# Patient Record
Sex: Male | Born: 1965
Health system: Southern US, Community
[De-identification: ages and names within clinical notes are randomized; demographics above are authoritative.]

## PROBLEM LIST (undated history)

## (undated) DIAGNOSIS — E119 Type 2 diabetes mellitus without complications: Secondary | ICD-10-CM

## (undated) DIAGNOSIS — M109 Gout, unspecified: Secondary | ICD-10-CM

## (undated) DIAGNOSIS — K118 Other diseases of salivary glands: Secondary | ICD-10-CM

## (undated) DIAGNOSIS — M1990 Unspecified osteoarthritis, unspecified site: Secondary | ICD-10-CM

## (undated) HISTORY — DX: Unspecified osteoarthritis, unspecified site: M19.90

## (undated) HISTORY — PX: TONSILLECTOMY: SUR1361

## (undated) HISTORY — DX: Type 2 diabetes mellitus without complications: E11.9

---

## 2015-05-24 DIAGNOSIS — M5134 Other intervertebral disc degeneration, thoracic region: Secondary | ICD-10-CM | POA: Insufficient documentation

## 2018-02-15 ENCOUNTER — Ambulatory Visit: Payer: BLUE CROSS/BLUE SHIELD | Admitting: Family Medicine

## 2018-02-15 ENCOUNTER — Encounter: Payer: Self-pay | Admitting: Family Medicine

## 2018-02-15 VITALS — BP 130/82 | HR 77 | Temp 97.4°F | Ht 71.0 in | Wt 221.8 lb

## 2018-02-15 DIAGNOSIS — Z23 Encounter for immunization: Secondary | ICD-10-CM | POA: Diagnosis not present

## 2018-02-15 DIAGNOSIS — M10072 Idiopathic gout, left ankle and foot: Secondary | ICD-10-CM

## 2018-02-15 LAB — BASIC METABOLIC PANEL
BUN: 14 mg/dL (ref 7–25)
CALCIUM: 9.3 mg/dL (ref 8.6–10.3)
CO2: 24 mmol/L (ref 20–32)
Chloride: 105 mmol/L (ref 98–110)
Creat: 0.89 mg/dL (ref 0.70–1.33)
GLUCOSE: 122 mg/dL — AB (ref 65–99)
Potassium: 4 mmol/L (ref 3.5–5.3)
SODIUM: 140 mmol/L (ref 135–146)

## 2018-02-15 MED ORDER — COLCHICINE 0.6 MG PO TABS: ORAL_TABLET | ORAL | 0 refills | Status: DC

## 2018-02-15 NOTE — Patient Instructions (Signed)

## 2018-02-15 NOTE — Assessment & Plan Note (Signed)
-  History and clinical exam consistent with gout flare -Has tried ibuprofen without significant improvement. -Recommend trial of colchicine, check renal function today.  -Will plan to check uric acid once acute flare has resolved.

## 2018-02-15 NOTE — Progress Notes (Signed)
Joshua Morse - 52 y.o. male MRN [id number]  Date of birth: 07/18/1965  Subjective Chief Complaint  Patient presents with  . Gout    HPI Joshua Morse is a 52 y.o. male with history of gout here today with complaint of gout flare.  He reports pain and swelling in the L ankle. He typically has gout flares in the ankle.  This  episode started about 2 Fuente ago, started to improve some a few days ago but then returned.  He denies trauma to the ankle.  He has been taking ibuprofen which has helped some but symptoms have persisted.   He does drink a moderate amount of alcohol but denies any other recent dietary changes.  He has been staying well hydrated.  He does not think he has ever had uric acid levels checked.   ROS:  A comprehensive ROS was completed and negative except as noted per HPI  No Known Allergies  History reviewed. No pertinent past medical history.  Past Surgical History:  Procedure Laterality Date  . TONSILLECTOMY      Social History   Socioeconomic History  . Marital status: Married    Spouse name: Not on file  . Number of children: Not on file  . Years of education: Not on file  . Highest education level: Not on file  Occupational History  . Not on file  Social Needs  . Financial resource strain: Not on file  . Food insecurity:    Worry: Not on file    Inability: Not on file  . Transportation needs:    Medical: Not on file    Non-medical: Not on file  Tobacco Use  . Smoking status: Former Research scientist (life sciences)  . Smokeless tobacco: Never Used  Substance and Sexual Activity  . Alcohol use: Yes    Frequency: Never    Comment: ocass  . Drug use: Never  . Sexual activity: Not on file  Lifestyle  . Physical activity:    Days per week: Not on file    Minutes per session: Not on file  . Stress: Not on file  Relationships  . Social connections:    Talks on phone: Not on file    Gets together: Not on file    Attends religious service: Not on file    Active member of  club or organization: Not on file    Attends meetings of clubs or organizations: Not on file    Relationship status: Not on file  Other Topics Concern  . Not on file  Social History Narrative  . Not on file    Family History  Problem Relation Age of Onset  . Gout Maternal Grandmother   . Gout Paternal Grandfather     Health Maintenance  Topic Date Due  . Samul Dada  08/31/1982  . COLONOSCOPY  08/30/2013  . INFLUENZA VACCINE  11/08/2017  . Hepatitis C Screening  02/16/2019 (Originally March 09, 1966)  . HIV Screening  02/16/2019 (Originally 08/31/1978)    ----------------------------------------------------------------------------------------------------------------------------------------------------------------------------------------------------------------- Physical Exam BP 130/82   Pulse 77   Temp (!) 97.4 F (36.3 C) (Oral)   Ht 5\' 11"  (1.803 m)   Wt 221 lb 12.8 oz (100.6 kg)   SpO2 95%   BMI 30.93 kg/m   Physical Exam  Constitutional: He is oriented to person, place, and time. He appears well-nourished. No distress.  HENT:  Head: Normocephalic and atraumatic.  Mouth/Throat: Oropharynx is clear and moist.  Eyes: No scleral icterus.  Neck: Neck supple.  Cardiovascular: Normal  rate, regular rhythm and normal heart sounds.  Pulmonary/Chest: Effort normal and breath sounds normal.  Musculoskeletal:  L ankle with effusion and tenderness to palpation.  Warm to touch.  DP pulse 1+  Neurological: He is alert and oriented to person, place, and time.  Skin: Skin is warm and dry.  Psychiatric: He has a normal mood and affect. His behavior is normal.    ------------------------------------------------------------------------------------------------------------------------------------------------------------------------------------------------------------------- Assessment and Plan  Acute idiopathic gout of left ankle -History and clinical exam consistent with gout  flare -Has tried ibuprofen without significant improvement. -Recommend trial of colchicine, check renal function today.  -Will plan to check uric acid once acute flare has resolved.

## 2018-02-15 NOTE — Progress Notes (Signed)
Joshua Morse - 52 y.o. male MRN [id number]  Date of birth: 1965-11-19  Subjective Chief Complaint  Patient presents with  . Gout    HPI 52 year old presents today to establish care. He presents with gout exacerbation to left ankle that began two Deoliveira ago. Patient has had previous exacerbations that were managed with anti-inflammatories, increasing fluids, and reducing purines in diet. However, those methods have not resolved the current exacerbation. Patient reports taking scheduled ibuprofen with minimal relief. Patient denies any recent changes to his diet to contribute to this exacerbation.    No Known Allergies  History reviewed. No pertinent past medical history.  History reviewed. No pertinent surgical history.  Social History   Socioeconomic History  . Marital status: Married    Spouse name: Not on file  . Number of children: Not on file  . Years of education: Not on file  . Highest education level: Not on file  Occupational History  . Not on file  Social Needs  . Financial resource strain: Not on file  . Food insecurity:    Worry: Not on file    Inability: Not on file  . Transportation needs:    Medical: Not on file    Non-medical: Not on file  Tobacco Use  . Smoking status: Former Research scientist (life sciences)  . Smokeless tobacco: Never Used  Substance and Sexual Activity  . Alcohol use: Yes    Frequency: Never    Comment: ocass  . Drug use: Never  . Sexual activity: Not on file  Lifestyle  . Physical activity:    Days per week: Not on file    Minutes per session: Not on file  . Stress: Not on file  Relationships  . Social connections:    Talks on phone: Not on file    Gets together: Not on file    Attends religious service: Not on file    Active member of club or organization: Not on file    Attends meetings of clubs or organizations: Not on file    Relationship status: Not on file  Other Topics Concern  . Not on file  Social History Narrative  . Not on file     Family History  Problem Relation Age of Onset  . Gout Maternal Grandmother   . Gout Paternal Grandfather     Health Maintenance  Topic Date Due  . Samul Dada  08/31/1982  . COLONOSCOPY  08/30/2013  . INFLUENZA VACCINE  11/08/2017  . Hepatitis C Screening  02/16/2019 (Originally Aug 17, 1965)  . HIV Screening  02/16/2019 (Originally 08/31/1978)    ROS Cardiac: Pt denies chest pain, pressure, tightness Pulmonary: Pt denies shortness of breath, dyspnea, or cough.  Musculoskeletal: Pt denies tenderness to other joints.  ----------------------------------------------------------------------------------------------------------------------------------------------------------------------------------------------------------------- Physical Exam BP 130/82   Pulse 77   Temp (!) 97.4 F (36.3 C) (Oral)   Ht 5\' 11"  (1.803 m)   Wt 221 lb 12.8 oz (100.6 kg)   SpO2 95%   BMI 30.93 kg/m   Physical Exam  Constitutional: He is oriented to person, place, and time.  Cardiovascular: Normal rate, regular rhythm and normal heart sounds.  Pulses:      Dorsalis pedis pulses are 1+ on the left side.  Pulmonary/Chest: Effort normal and breath sounds normal.  Musculoskeletal:       Left ankle: He exhibits swelling. Tenderness.  Neurological: He is alert and oriented to person, place, and time.  Skin: Skin is warm and dry. Capillary refill takes less than 2  seconds.    ------------------------------------------------------------------------------------------------------------------------------------------------------------------------------------------------------------------- Assessment and Plan  Gout -Begin taking Cholchicine 1.2mg  today and then 0.6mg  1 hour afterwards. Continue taking 0.6mg  daily until gout resolves.  -Continue increasing fluids and reduced purine diet -BMP

## 2018-02-18 NOTE — Progress Notes (Signed)
Normal kidney function

## 2018-03-01 ENCOUNTER — Other Ambulatory Visit: Payer: Self-pay | Admitting: Family Medicine

## 2018-03-01 ENCOUNTER — Telehealth: Payer: Self-pay

## 2018-03-01 MED ORDER — COLCHICINE 0.6 MG PO TABS: ORAL_TABLET | ORAL | 0 refills | Status: DC

## 2018-03-01 NOTE — Telephone Encounter (Signed)
Please advise 

## 2018-03-01 NOTE — Telephone Encounter (Signed)
Left a detailed VM for patient regarding prescription being sent to the pharmacy.

## 2018-03-01 NOTE — Telephone Encounter (Signed)
Copied from [place] [id number]. Topic: General - Other >> Mar 01, 2018  8:09 AM Yvette Rack wrote: Reason for CRM: pt calling wanting Dr Zigmund Daniel to know that his symptom has come back he has pain in his lt ankle and would like to know what he should do he was given colchicine 0.6 MG tablet and now he's out

## 2018-03-01 NOTE — Telephone Encounter (Signed)
Renewal for colchicine sent in.

## 2018-04-16 ENCOUNTER — Encounter: Payer: Self-pay | Admitting: Family Medicine

## 2018-04-16 ENCOUNTER — Ambulatory Visit (INDEPENDENT_AMBULATORY_CARE_PROVIDER_SITE_OTHER): Payer: BLUE CROSS/BLUE SHIELD | Admitting: Family Medicine

## 2018-04-16 VITALS — BP 132/70 | HR 66 | Temp 98.0°F | Ht 71.0 in | Wt 222.0 lb

## 2018-04-16 DIAGNOSIS — R7309 Other abnormal glucose: Secondary | ICD-10-CM | POA: Diagnosis not present

## 2018-04-16 DIAGNOSIS — Z0000 Encounter for general adult medical examination without abnormal findings: Secondary | ICD-10-CM | POA: Insufficient documentation

## 2018-04-16 DIAGNOSIS — Z13220 Encounter for screening for lipoid disorders: Secondary | ICD-10-CM | POA: Diagnosis not present

## 2018-04-16 DIAGNOSIS — M109 Gout, unspecified: Secondary | ICD-10-CM

## 2018-04-16 DIAGNOSIS — L309 Dermatitis, unspecified: Secondary | ICD-10-CM | POA: Insufficient documentation

## 2018-04-16 DIAGNOSIS — Z125 Encounter for screening for malignant neoplasm of prostate: Secondary | ICD-10-CM | POA: Diagnosis not present

## 2018-04-16 DIAGNOSIS — R220 Localized swelling, mass and lump, head: Secondary | ICD-10-CM

## 2018-04-16 LAB — COMPREHENSIVE METABOLIC PANEL
ALK PHOS: 113 U/L (ref 39–117)
ALT: 35 U/L (ref 0–53)
AST: 25 U/L (ref 0–37)
Albumin: 4.3 g/dL (ref 3.5–5.2)
BILIRUBIN TOTAL: 0.5 mg/dL (ref 0.2–1.2)
BUN: 13 mg/dL (ref 6–23)
CALCIUM: 9.6 mg/dL (ref 8.4–10.5)
CO2: 26 mEq/L (ref 19–32)
Chloride: 106 mEq/L (ref 96–112)
Creatinine, Ser: 0.92 mg/dL (ref 0.40–1.50)
GFR: 90.91 mL/min (ref >60.00)
Glucose, Bld: 119 mg/dL — ABNORMAL HIGH (ref 70–99)
Potassium: 4.4 mEq/L (ref 3.5–5.1)
Sodium: 139 mEq/L (ref 135–145)
TOTAL PROTEIN: 6.8 g/dL (ref 6.0–8.3)

## 2018-04-16 LAB — CBC
HEMATOCRIT: 47.9 % (ref 39.0–52.0)
Hemoglobin: 16 g/dL (ref 13.0–17.0)
MCHC: 33.5 g/dL (ref 30.0–36.0)
MCV: 97.9 fl (ref 78.0–100.0)
Platelets: 255 10*3/uL (ref 150.0–400.0)
RBC: 4.89 Mil/uL (ref 4.22–5.81)
RDW: 13.5 % (ref 11.5–15.5)
WBC: 8.6 10*3/uL (ref 4.0–10.5)

## 2018-04-16 LAB — LIPID PANEL
CHOLESTEROL: 219 mg/dL — AB (ref 0–200)
HDL: 45.4 mg/dL (ref >39.00)
LDL Cholesterol: 149 mg/dL — ABNORMAL HIGH (ref 0–99)
NonHDL: 173.41
TRIGLYCERIDES: 123 mg/dL (ref 0.0–149.0)
Total CHOL/HDL Ratio: 5
VLDL: 24.6 mg/dL (ref 0.0–40.0)

## 2018-04-16 LAB — PSA: PSA: 1.06 ng/mL (ref 0.10–4.00)

## 2018-04-16 LAB — URIC ACID: Uric Acid, Serum: 7.2 mg/dL (ref 4.0–7.8)

## 2018-04-16 LAB — TSH: TSH: 1.68 u[IU]/mL (ref 0.35–4.50)

## 2018-04-16 LAB — HEMOGLOBIN A1C: Hgb A1c MFr Bld: 6.2 % (ref 4.6–6.5)

## 2018-04-16 MED ORDER — COLCHICINE 0.6 MG PO TABS: ORAL_TABLET | ORAL | 0 refills | Status: DC

## 2018-04-16 NOTE — Assessment & Plan Note (Signed)
Well adult Orders Placed This Encounter  Procedures  . Comp Met (CMET)  . HgB A1c  . Lipid Profile  . TSH  . PSA  . CBC  . Uric acid  -Screenings:  Cologuard, lipid and psa screenings -Immunizations:  Up to date -Anticipatory guidance/Risk factor reduction:  Recommendations per AVS

## 2018-04-16 NOTE — Patient Instructions (Signed)

## 2018-04-16 NOTE — Assessment & Plan Note (Signed)
-  Unclear etiology, will monitor over the next couple of Schranz and if not resolving recommend ENT evaluation.

## 2018-04-16 NOTE — Assessment & Plan Note (Signed)
-  Area on leg consistent with eczema, recommend trial of OTC cortisone cream.

## 2018-04-16 NOTE — Progress Notes (Signed)
Joshua Morse - 53 y.o. male MRN [id number]  Date of birth: 01/28/1966  Subjective Chief Complaint  Patient presents with  . Annual Exam    wants to discuss swelling on left cheek and rash lower left leg has been applying OTC cream    HPI Joshua Morse is a 53 y.o. male with history of gout here today for annual exam.  Previous gout flare has cleared up.  He has noticed swelling of left cheek/jaw area about 2 Liddy ago.  He denies pain, difficulty swallowing, fever, chills or chewing.  Had dental implant last summer.  He also noticed small, itchy, scaly patch on L lower extremity.  Using benadryl cream without improvement.  Otherwise doing well.  He has never had colon cancer screening.  He is a non-smoker.   Review of Systems  Constitutional: Negative for chills, fever, malaise/fatigue and weight loss.  HENT: Negative for congestion, ear pain and sore throat.   Eyes: Negative for blurred vision, double vision and pain.  Respiratory: Negative for cough and shortness of breath.   Cardiovascular: Negative for chest pain and palpitations.  Gastrointestinal: Negative for abdominal pain, blood in stool, constipation, heartburn and nausea.  Genitourinary: Negative for dysuria and urgency.  Musculoskeletal: Negative for joint pain and myalgias.  Skin: Positive for rash.  Neurological: Negative for dizziness and headaches.  Endo/Heme/Allergies: Does not bruise/bleed easily.  Psychiatric/Behavioral: Negative for depression. The patient is not nervous/anxious and does not have insomnia.     No Known Allergies  No past medical history on file.  Past Surgical History:  Procedure Laterality Date  . TONSILLECTOMY      Social History   Socioeconomic History  . Marital status: Married    Spouse name: Not on file  . Number of children: Not on file  . Years of education: Not on file  . Highest education level: Not on file  Occupational History  . Not on file  Social Needs  . Financial  resource strain: Not on file  . Food insecurity:    Worry: Not on file    Inability: Not on file  . Transportation needs:    Medical: Not on file    Non-medical: Not on file  Tobacco Use  . Smoking status: Former Research scientist (life sciences)  . Smokeless tobacco: Never Used  Substance and Sexual Activity  . Alcohol use: Yes    Frequency: Never    Comment: ocass  . Drug use: Never  . Sexual activity: Not on file  Lifestyle  . Physical activity:    Days per week: Not on file    Minutes per session: Not on file  . Stress: Not on file  Relationships  . Social connections:    Talks on phone: Not on file    Gets together: Not on file    Attends religious service: Not on file    Active member of club or organization: Not on file    Attends meetings of clubs or organizations: Not on file    Relationship status: Not on file  Other Topics Concern  . Not on file  Social History Narrative  . Not on file    Family History  Problem Relation Age of Onset  . Gout Maternal Grandmother   . Gout Paternal Grandfather     Health Maintenance  Topic Date Due  . Samul Dada  08/31/1982  . COLONOSCOPY  08/30/2013  . Hepatitis C Screening  02/16/2019 (Originally 1965/06/29)  . HIV Screening  02/16/2019 (Originally 08/31/1978)  .  INFLUENZA VACCINE  Completed    ----------------------------------------------------------------------------------------------------------------------------------------------------------------------------------------------------------------- Physical Exam BP 132/70   Pulse 66   Temp 98 F (36.7 C) (Oral)   Ht _0  (1.803 m)   Wt 222 lb (100.7 kg)   SpO2 98%   BMI 30.96 kg/m   Physical Exam Constitutional:      General: He is not in acute distress. HENT:     Head: Normocephalic and atraumatic.     Right Ear: External ear normal.     Left Ear: External ear normal.     Nose: No congestion.     Mouth/Throat:     Mouth: Mucous membranes are moist.     Comments: assymetry  of masseter area L>R.  Non-tender, no adenopathy.  Eyes:     General: No scleral icterus. Neck:     Musculoskeletal: Normal range of motion.     Thyroid: No thyromegaly.  Cardiovascular:     Rate and Rhythm: Normal rate and regular rhythm.     Heart sounds: Normal heart sounds.  Pulmonary:     Effort: Pulmonary effort is normal.     Breath sounds: Normal breath sounds.  Abdominal:     General: Bowel sounds are normal. There is no distension.     Palpations: Abdomen is soft.     Tenderness: There is no abdominal tenderness. There is no guarding.  Lymphadenopathy:     Cervical: No cervical adenopathy.  Skin:    General: Skin is warm and dry.     Findings: Rash (small, scaly patch left anterior lower leg. ) present.  Neurological:     Mental Status: He is alert and oriented to person, place, and time.     Cranial Nerves: No cranial nerve deficit.     Motor: No abnormal muscle tone.  Psychiatric:        Behavior: Behavior normal.     ------------------------------------------------------------------------------------------------------------------------------------------------------------------------------------------------------------------- Assessment and Plan  Well adult exam Well adult Orders Placed This Encounter  Procedures  . Comp Met (CMET)  . HgB A1c  . Lipid Profile  . TSH  . PSA  . CBC  . Uric acid  -Screenings:  Cologuard, lipid and psa screenings -Immunizations:  Up to date -Anticipatory guidance/Risk factor reduction:  Recommendations per AVS  Jaw swelling -Unclear etiology, will monitor over the next couple of W[place] and if not resolving recommend ENT evaluation.   Eczema -Area on leg consistent with eczema, recommend trial of OTC cortisone cream.

## 2018-04-22 NOTE — Progress Notes (Signed)
-  Blood sugar and cholesterol are elevated.  I would recommend a low carb/low fat diet with routine exercise to help improve these numbers.  We'll plan to re-check in about 6 months.  -Uric acid levels are at the high end of normal.  We'll continue to monitor for now.

## 2018-04-29 DIAGNOSIS — Z1212 Encounter for screening for malignant neoplasm of rectum: Secondary | ICD-10-CM | POA: Diagnosis not present

## 2018-04-29 DIAGNOSIS — Z1211 Encounter for screening for malignant neoplasm of colon: Secondary | ICD-10-CM | POA: Diagnosis not present

## 2018-04-29 LAB — COLOGUARD

## 2018-06-05 ENCOUNTER — Telehealth: Payer: Self-pay | Admitting: Family Medicine

## 2018-06-05 DIAGNOSIS — L309 Dermatitis, unspecified: Secondary | ICD-10-CM

## 2018-06-05 NOTE — Telephone Encounter (Signed)
Pt requesting a referral to Dermatology re: spots on shin as discussed in last OV.   Pt: [phone]

## 2018-06-05 NOTE — Telephone Encounter (Signed)
[facility] to [facility] referral

## 2018-06-05 NOTE — Telephone Encounter (Signed)
[facility] to [facility] referral.

## 2018-06-06 NOTE — Telephone Encounter (Signed)
Referral placed, patient notified.

## 2018-06-15 DIAGNOSIS — L308 Other specified dermatitis: Secondary | ICD-10-CM | POA: Diagnosis not present

## 2018-07-13 DIAGNOSIS — D171 Benign lipomatous neoplasm of skin and subcutaneous tissue of trunk: Secondary | ICD-10-CM | POA: Diagnosis not present

## 2018-07-13 DIAGNOSIS — L309 Dermatitis, unspecified: Secondary | ICD-10-CM | POA: Diagnosis not present

## 2018-10-02 DIAGNOSIS — Z03818 Encounter for observation for suspected exposure to other biological agents ruled out: Secondary | ICD-10-CM | POA: Diagnosis not present

## 2018-10-14 ENCOUNTER — Encounter: Payer: Self-pay | Admitting: Family Medicine

## 2018-10-14 ENCOUNTER — Ambulatory Visit: Payer: BC Managed Care – PPO | Admitting: Family Medicine

## 2018-10-14 VITALS — BP 118/90 | HR 71 | Temp 98.1°F | Resp 18 | Ht 71.0 in | Wt 224.0 lb

## 2018-10-14 DIAGNOSIS — M10072 Idiopathic gout, left ankle and foot: Secondary | ICD-10-CM

## 2018-10-14 DIAGNOSIS — R739 Hyperglycemia, unspecified: Secondary | ICD-10-CM

## 2018-10-14 DIAGNOSIS — E7800 Pure hypercholesterolemia, unspecified: Secondary | ICD-10-CM | POA: Diagnosis not present

## 2018-10-14 LAB — LIPID PANEL
Cholesterol: 186 mg/dL (ref 0–200)
HDL: 41.1 mg/dL (ref >39.00)
NonHDL: 145.33
Total CHOL/HDL Ratio: 5
Triglycerides: 222 mg/dL — ABNORMAL HIGH (ref 0.0–149.0)
VLDL: 44.4 mg/dL — ABNORMAL HIGH (ref 0.0–40.0)

## 2018-10-14 LAB — BASIC METABOLIC PANEL
BUN: 17 mg/dL (ref 6–23)
CO2: 25 mEq/L (ref 19–32)
Calcium: 9.8 mg/dL (ref 8.4–10.5)
Chloride: 106 mEq/L (ref 96–112)
Creatinine, Ser: 1.01 mg/dL (ref 0.40–1.50)
GFR: 76.66 mL/min (ref >60.00)
Glucose, Bld: 116 mg/dL — ABNORMAL HIGH (ref 70–99)
Potassium: 4.6 mEq/L (ref 3.5–5.1)
Sodium: 139 mEq/L (ref 135–145)

## 2018-10-14 LAB — LDL CHOLESTEROL, DIRECT: Direct LDL: 117 mg/dL

## 2018-10-14 LAB — HEMOGLOBIN A1C: Hgb A1c MFr Bld: 6.3 % (ref 4.6–6.5)

## 2018-10-14 LAB — URIC ACID: Uric Acid, Serum: 8.1 mg/dL — ABNORMAL HIGH (ref 4.0–7.8)

## 2018-10-14 MED ORDER — COLCHICINE 0.6 MG PO TABS: ORAL_TABLET | ORAL | 2 refills | Status: DC

## 2018-10-14 NOTE — Assessment & Plan Note (Signed)
Update blood sugar and a1c.

## 2018-10-14 NOTE — Patient Instructions (Signed)
Gout  Gout is painful swelling of your joints. Gout is a type of arthritis. It is caused by having too much uric acid in your body. Uric acid is a chemical that is made when your body breaks down substances called purines. If your body has too much uric acid, sharp crystals can form and build up in your joints. This causes pain and swelling. Gout attacks can happen quickly and be very painful (acute gout). Over time, the attacks can affect more joints and happen more often (chronic gout). What are the causes?  Too much uric acid in your blood. This can happen because: ? Your kidneys do not remove enough uric acid from your blood. ? Your body makes too much uric acid. ? You eat too many foods that are high in purines. These foods include organ meats, some seafood, and beer.  Trauma or stress. What increases the risk?  Having a family history of gout.  Being male and middle-aged.  Being male and having gone through menopause.  Being very overweight (obese).  Drinking alcohol, especially beer.  Not having enough water in the body (being dehydrated).  Losing weight too quickly.  Having an organ transplant.  Having lead poisoning.  Taking certain medicines.  Having kidney disease.  Having a skin condition called psoriasis. What are the signs or symptoms? An attack of acute gout usually happens in just one joint. The most common place is the big toe. Attacks often start at night. Other joints that may be affected include joints of the feet, ankle, knee, fingers, wrist, or elbow. Symptoms of an attack may include:  Very bad pain.  Warmth.  Swelling.  Stiffness.  Shiny, red, or purple skin.  Tenderness. The affected joint may be very painful to touch.  Chills and fever. Chronic gout may cause symptoms more often. More joints may be involved. You may also have white or yellow lumps (tophi) on your hands or feet or in other areas near your joints. How is this  treated?  Treatment for this condition has two phases: treating an acute attack and preventing future attacks.  Acute gout treatment may include: ? NSAIDs. ? Steroids. These are taken by mouth or injected into a joint. ? Colchicine. This medicine relieves pain and swelling. It can be given by mouth or through an IV tube.  Preventive treatment may include: ? Taking small doses of NSAIDs or colchicine daily. ? Using a medicine that reduces uric acid levels in your blood. ? Making changes to your diet. You may need to see a food expert (dietitian) about what to eat and drink to prevent gout. Follow these instructions at home: During a gout attack   If told, put ice on the painful area: ? Put ice in a plastic bag. ? Place a towel between your skin and the bag. ? Leave the ice on for 20 minutes, 2-3 times a day.  Raise (elevate) the painful joint above the level of your heart as often as you can.  Rest the joint as much as possible. If the joint is in your leg, you may be given crutches.  Follow instructions from your doctor about what you cannot eat or drink. Avoiding future gout attacks  Eat a low-purine diet. Avoid foods and drinks such as: ? Liver. ? Kidney. ? Anchovies. ? Asparagus. ? Herring. ? Mushrooms. ? Mussels. ? Beer.  Stay at a healthy weight. If you want to lose weight, talk with your doctor. Do not lose weight   too fast.  Start or continue an exercise plan as told by your doctor. Eating and drinking  Drink enough fluids to keep your pee (urine) pale yellow.  If you drink alcohol: ? Limit how much you use to:  0-1 drink a day for women.  0-2 drinks a day for men. ? Be aware of how much alcohol is in your drink. In the [place] one drink equals one 12 oz bottle of beer (355 mL), one 5 oz glass of wine (148 mL), or one 1 oz glass of hard liquor (44 mL). General instructions  Take over-the-counter and prescription medicines only as told by your doctor.  Do  not drive or use heavy machinery while taking prescription pain medicine.  Return to your normal activities as told by your doctor. Ask your doctor what activities are safe for you.  Keep all follow-up visits as told by your doctor. This is important. Contact a doctor if:  You have another gout attack.  You still have symptoms of a gout attack after 10 days of treatment.  You have problems (side effects) because of your medicines.  You have chills or a fever.  You have burning pain when you pee (urinate).  You have pain in your lower back or belly. Get help right away if:  You have very bad pain.  Your pain cannot be controlled.  You cannot pee. Summary  Gout is painful swelling of the joints.  The most common site of pain is the big toe, but it can affect other joints.  Medicines and avoiding some foods can help to prevent and treat gout attacks. This information is not intended to replace advice given to you by your health care provider. Make sure you discuss any questions you have with your health care provider. Document Released: 01/04/2008 Document Revised: 10/17/2017 Document Reviewed: 10/17/2017 [employer]vier Patient Education  2020 [employer]  

## 2018-10-14 NOTE — Progress Notes (Signed)
Joshua Morse - 53 y.o. male MRN [id number]  Date of birth: [birth year]  Subjective Chief Complaint  Patient presents with  . Follow-up    6 mo F/U Gout/HDL?DM , 1 X flare-up in 06/2018    HPI Joshua Morse is a 53 y.o. male here today for follow up of gout as well as elevated glucose and lipids on prior lab work.  He reports he is doing well.  He had a gout flare of the L ankle in March that resolved with colchicine.  He has been working on dietary changes to better manage blood sugar, cholesterol and gout flares.    ROS:  A comprehensive ROS was completed and negative except as noted per HPI  No Known Allergies  No past medical history on file.  Past Surgical History:  Procedure Laterality Date  . TONSILLECTOMY      Social History   Socioeconomic History  . Marital status: Married    Spouse name: Not on file  . Number of children: Not on file  . Years of education: Not on file  . Highest education level: Not on file  Occupational History  . Not on file  Social Needs  . Financial resource strain: Not on file  . Food insecurity    Worry: Not on file    Inability: Not on file  . Transportation needs    Medical: Not on file    Non-medical: Not on file  Tobacco Use  . Smoking status: Former Research scientist (life sciences)  . Smokeless tobacco: Never Used  Substance and Sexual Activity  . Alcohol use: Yes    Frequency: Never    Comment: ocass  . Drug use: Never  . Sexual activity: Not on file  Lifestyle  . Physical activity    Days per week: Not on file    Minutes per session: Not on file  . Stress: Not on file  Relationships  . Social Herbalist on phone: Not on file    Gets together: Not on file    Attends religious service: Not on file    Active member of club or organization: Not on file    Attends meetings of clubs or organizations: Not on file    Relationship status: Not on file  Other Topics Concern  . Not on file  Social History Narrative  . Not on file     Family History  Problem Relation Age of Onset  . Gout Maternal Grandmother   . Gout Paternal Grandfather     Health Maintenance  Topic Date Due  . Samul Dada  08/31/1982  . Hepatitis C Screening  02/16/2019 (Originally 1965/08/21)  . HIV Screening  02/16/2019 (Originally 08/31/1978)  . INFLUENZA VACCINE  11/09/2018  . Fecal DNA (Cologuard)  04/29/2021    ----------------------------------------------------------------------------------------------------------------------------------------------------------------------------------------------------------------- Physical Exam BP 118/90   Pulse 71   Temp 98.1 F (36.7 C) (Oral)   Resp 18   Ht 5\' 11"  (1.803 m)   Wt 224 lb (101.6 kg)   SpO2 98%   BMI 31.24 kg/m   Physical Exam Constitutional:      Appearance: Normal appearance.  HENT:     Head: Normocephalic and atraumatic.     Mouth/Throat:     Mouth: Mucous membranes are moist.  Eyes:     General: No scleral icterus. Neck:     Musculoskeletal: Neck supple.  Cardiovascular:     Rate and Rhythm: Normal rate and regular rhythm.  Pulmonary:     Effort: Pulmonary effort  is normal.     Breath sounds: Normal breath sounds.  Skin:    General: Skin is warm and dry.  Neurological:     General: No focal deficit present.     Mental Status: He is alert.  Psychiatric:        Mood and Affect: Mood normal.        Behavior: Behavior normal.     ------------------------------------------------------------------------------------------------------------------------------------------------------------------------------------------------------------------- Assessment and Plan  Acute idiopathic gout of left ankle Gout flares well controlled with colchicine as needed.  Update uric acid levels today.   Elevated cholesterol -Recheck lipid panel today.   Elevated blood sugar Update blood sugar and a1c.

## 2018-10-14 NOTE — Assessment & Plan Note (Signed)
-  Recheck lipid panel today

## 2018-10-14 NOTE — Assessment & Plan Note (Signed)
Gout flares well controlled with colchicine as needed.  Update uric acid levels today.

## 2018-10-16 NOTE — Progress Notes (Signed)
-  Cholesterol and TG are up some.  Recommend a low fat/low cholesterol diet with regular exercise.   -Uric acid is mildly elevated, if he starts to have increased gout flares let me know.  -Additional labs look ok.

## 2019-02-07 ENCOUNTER — Other Ambulatory Visit: Payer: Self-pay

## 2019-02-07 DIAGNOSIS — Z20822 Contact with and (suspected) exposure to covid-19: Secondary | ICD-10-CM

## 2019-02-09 LAB — NOVEL CORONAVIRUS, NAA: SARS-CoV-2, NAA: NOT DETECTED

## 2019-11-05 ENCOUNTER — Other Ambulatory Visit: Payer: Self-pay

## 2019-11-05 ENCOUNTER — Encounter: Payer: Self-pay | Admitting: Family Medicine

## 2019-11-05 ENCOUNTER — Ambulatory Visit: Payer: BC Managed Care – PPO | Admitting: Family Medicine

## 2019-11-05 VITALS — BP 130/100 | HR 76 | Temp 98.2°F | Ht 71.0 in | Wt 222.2 lb

## 2019-11-05 DIAGNOSIS — R739 Hyperglycemia, unspecified: Secondary | ICD-10-CM

## 2019-11-05 DIAGNOSIS — R220 Localized swelling, mass and lump, head: Secondary | ICD-10-CM | POA: Diagnosis not present

## 2019-11-05 DIAGNOSIS — E7800 Pure hypercholesterolemia, unspecified: Secondary | ICD-10-CM

## 2019-11-05 DIAGNOSIS — M109 Gout, unspecified: Secondary | ICD-10-CM | POA: Diagnosis not present

## 2019-11-05 LAB — LIPID PANEL
Cholesterol: 183 mg/dL (ref 0–200)
HDL: 41.4 mg/dL (ref >39.00)
NonHDL: 141.31
Total CHOL/HDL Ratio: 4
Triglycerides: 220 mg/dL — ABNORMAL HIGH (ref 0.0–149.0)
VLDL: 44 mg/dL — ABNORMAL HIGH (ref 0.0–40.0)

## 2019-11-05 LAB — CBC
HCT: 46.7 % (ref 39.0–52.0)
Hemoglobin: 15.9 g/dL (ref 13.0–17.0)
MCHC: 34.1 g/dL (ref 30.0–36.0)
MCV: 99.4 fl (ref 78.0–100.0)
Platelets: 228 10*3/uL (ref 150.0–400.0)
RBC: 4.7 Mil/uL (ref 4.22–5.81)
RDW: 13.9 % (ref 11.5–15.5)
WBC: 10.8 10*3/uL — ABNORMAL HIGH (ref 4.0–10.5)

## 2019-11-05 LAB — BASIC METABOLIC PANEL
BUN: 11 mg/dL (ref 6–23)
CO2: 25 mEq/L (ref 19–32)
Calcium: 9.4 mg/dL (ref 8.4–10.5)
Chloride: 107 mEq/L (ref 96–112)
Creatinine, Ser: 1 mg/dL (ref 0.40–1.50)
GFR: 77.25 mL/min (ref >60.00)
Glucose, Bld: 118 mg/dL — ABNORMAL HIGH (ref 70–99)
Potassium: 3.8 mEq/L (ref 3.5–5.1)
Sodium: 139 mEq/L (ref 135–145)

## 2019-11-05 LAB — ALT: ALT: 32 U/L (ref 0–53)

## 2019-11-05 LAB — URIC ACID: Uric Acid, Serum: 6.7 mg/dL (ref 4.0–7.8)

## 2019-11-05 LAB — LDL CHOLESTEROL, DIRECT: Direct LDL: 120 mg/dL

## 2019-11-05 LAB — AST: AST: 23 U/L (ref 0–37)

## 2019-11-05 LAB — HEMOGLOBIN A1C: Hgb A1c MFr Bld: 6.1 % (ref 4.6–6.5)

## 2019-11-05 NOTE — Progress Notes (Signed)
Joshua Morse is a 54 y.o. male  Chief Complaint  Patient presents with  . Acute Visit    Pt here due to swelling starting several Null ago, lt side.  Painful sometimes and also more swollen at times then others.    HPI: Joshua Morse is a 54 y.o. male who was previously a patient of Dr. Zigmund Daniel who complains of Lt sided facial swelling x several [place]. Size of swollen area fluctuates slightly. Sore at times when more swollen, not painful. No redness or overlying skin changes.  No SOB, trouble swallowing or speaking. No ear pain.  No fever, chills. No unexplained weight loss, night sweats. No recent dental work. No injury or trauma. No new meds/supplements.  He has not taken anything for symptoms.  Pt is a former smoker, quit 21 years ago. He does have a h/o gout   History reviewed. No pertinent past medical history.  Past Surgical History:  Procedure Laterality Date  . TONSILLECTOMY      Social History   Socioeconomic History  . Marital status: Married    Spouse name: Not on file  . Number of children: Not on file  . Years of education: Not on file  . Highest education level: Not on file  Occupational History  . Not on file  Tobacco Use  . Smoking status: Former Research scientist (life sciences)  . Smokeless tobacco: Never Used  Vaping Use  . Vaping Use: Never used  Substance and Sexual Activity  . Alcohol use: Yes    Comment: ocass  . Drug use: Never  . Sexual activity: Not on file  Other Topics Concern  . Not on file  Social History Narrative  . Not on file   Social Determinants of Health   Financial Resource Strain:   . Difficulty of Paying Living Expenses:   Food Insecurity:   . Worried About Charity fundraiser in the Last Year:   . Arboriculturist in the Last Year:   Transportation Needs:   . Film/video editor (Medical):   Marland Kitchen Lack of Transportation (Non-Medical):   Physical Activity:   . Days of Exercise per Week:   . Minutes of Exercise per Session:   Stress:   .  Feeling of Stress :   Social Connections:   . Frequency of Communication with Friends and Family:   . Frequency of Social Gatherings with Friends and Family:   . Attends Religious Services:   . Active Member of Clubs or Organizations:   . Attends Archivist Meetings:   Marland Kitchen Marital Status:   Intimate Partner Violence:   . Fear of Current or Ex-Partner:   . Emotionally Abused:   Marland Kitchen Physically Abused:   . Sexually Abused:     Family History  Problem Relation Age of Onset  . Gout Maternal Grandmother   . Gout Paternal Grandfather      Immunization History  Administered Date(s) Administered  . Influenza,inj,Quad PF,6+ Mos 02/15/2018    Outpatient Encounter Medications as of 11/05/2019  Medication Sig  . colchicine 0.6 MG tablet Take 1.2mg  initially followed by 0.6mg  1 hour later.  Continue 0.6mg  daily until resolution (Patient not taking: Reported on 11/05/2019)  . triamcinolone cream (KENALOG) 0.1 % APP EXT AA BID (Patient not taking: Reported on 11/05/2019)   No facility-administered encounter medications on file as of 11/05/2019.     ROS: Pertinent positives and negatives noted in HPI. Remainder of ROS non-contributory    No Known Allergies  BP (!) 130/100 (BP Location: Left Arm, Patient Position: Sitting, Cuff Size: Normal)   Pulse 76   Temp 98.2 F (36.8 C) (Temporal)   Ht 5\' 11"  (1.803 m)   Wt (!) 222 lb 3.2 oz (100.8 kg)   SpO2 98%   BMI 30.99 kg/m   BP Readings from Last 3 Encounters:  11/05/19 (!) 130/100  10/14/18 118/90  04/16/18 132/70     Physical Exam Constitutional:      General: He is not in acute distress.    Appearance: Normal appearance. He is not toxic-appearing.  HENT:     Head:     Jaw: No tenderness or pain on movement.   Lymphadenopathy:     Cervical: No cervical adenopathy.  Neurological:     Mental Status: He is alert and oriented to person, place, and time.  Psychiatric:        Mood and Affect: Mood normal.         Behavior: Behavior normal.      A/P:  1. Left facial swelling - ? Parotitis but not tender, also no erythema, warmth, and size of involved area fluctuates. Symptoms x few Shutes. No fever, chills. I am deferring abx therapy as not a clear bacterial infectious etiology. Will check labs and get [place] and f/u with pt when I have those results - US Soft Tissue Head/Neck (NON-THYROID); Future - CBC - Uric acid  2. Elevated cholesterol - Lipid panel - ALT - AST  3. Elevated blood sugar - Hemoglobin A1c  4. Gout of ankle, unspecified cause, unspecified chronicity, unspecified laterality - Basic metabolic panel - Uric acid    This visit occurred during the SARS-CoV-2 public health emergency.  Safety protocols were in place, including screening questions prior to the visit, additional usage of staff PPE, and extensive cleaning of exam room while observing appropriate contact time as indicated for disinfecting solutions.

## 2019-11-06 ENCOUNTER — Encounter: Payer: Self-pay | Admitting: Family Medicine

## 2019-11-11 ENCOUNTER — Telehealth: Payer: Self-pay | Admitting: General Practice

## 2019-11-11 ENCOUNTER — Telehealth: Payer: Self-pay

## 2019-11-11 ENCOUNTER — Other Ambulatory Visit: Payer: BC Managed Care – PPO

## 2019-11-11 ENCOUNTER — Ambulatory Visit
Admission: RE | Admit: 2019-11-11 | Discharge: 2019-11-11 | Disposition: A | Discharge status: Home / Self Care | Payer: BC Managed Care – PPO | Source: Ambulatory Visit | Attending: Family Medicine | Admitting: Family Medicine

## 2019-11-11 DIAGNOSIS — R220 Localized swelling, mass and lump, head: Secondary | ICD-10-CM

## 2019-11-11 DIAGNOSIS — K118 Other diseases of salivary glands: Secondary | ICD-10-CM

## 2019-11-11 DIAGNOSIS — K116 Mucocele of salivary gland: Secondary | ICD-10-CM | POA: Diagnosis not present

## 2019-11-11 NOTE — Telephone Encounter (Signed)
Received a call from [facility].  Soft tissue U/S of head and neck.  Cystic lesion Lt Parotid region findings, non-pacific/considerations include, cystic neoplasm or benign abscess difficult to exclude, contrast enhance CT recommended to further conclude.  Please see message.  Thank you.

## 2019-11-11 NOTE — Telephone Encounter (Signed)
error 

## 2019-11-12 NOTE — Telephone Encounter (Signed)
See scan report.

## 2019-11-12 NOTE — Telephone Encounter (Signed)
CT ordered and MyChart message sent to pt Joshua Morse, please call pt to make sure he checks his MyChart portal as I sent a message last week about his labs that he does not appear to have read. You can also let him know I ordered the f/u CT to further evaluate the cystic lesion in his Lt parotid gland.

## 2019-11-13 ENCOUNTER — Other Ambulatory Visit: Payer: BC Managed Care – PPO

## 2019-11-15 ENCOUNTER — Other Ambulatory Visit: Payer: Self-pay | Admitting: Family Medicine

## 2019-11-15 NOTE — Telephone Encounter (Signed)
I spoke to pt and he is aware of the message.  Pt said that he has an appointment set up in 2 week.

## 2019-11-24 ENCOUNTER — Other Ambulatory Visit: Payer: Self-pay | Admitting: Family Medicine

## 2019-11-25 ENCOUNTER — Inpatient Hospital Stay: Admission: RE | Admit: 2019-11-25 | Payer: BC Managed Care – PPO | Source: Ambulatory Visit

## 2019-11-25 ENCOUNTER — Ambulatory Visit
Admission: RE | Admit: 2019-11-25 | Discharge: 2019-11-25 | Disposition: A | Discharge status: Home / Self Care | Payer: BC Managed Care – PPO | Source: Ambulatory Visit | Attending: Family Medicine | Admitting: Family Medicine

## 2019-11-25 DIAGNOSIS — R220 Localized swelling, mass and lump, head: Secondary | ICD-10-CM

## 2019-11-25 DIAGNOSIS — K118 Other diseases of salivary glands: Secondary | ICD-10-CM | POA: Diagnosis not present

## 2019-11-25 DIAGNOSIS — J341 Cyst and mucocele of nose and nasal sinus: Secondary | ICD-10-CM | POA: Diagnosis not present

## 2019-11-25 DIAGNOSIS — J351 Hypertrophy of tonsils: Secondary | ICD-10-CM | POA: Diagnosis not present

## 2019-11-25 DIAGNOSIS — I700 Atherosclerosis of aorta: Secondary | ICD-10-CM | POA: Diagnosis not present

## 2019-11-25 MED ORDER — IOPAMIDOL (ISOVUE-300) INJECTION 61%
75.0000 mL | Freq: Once | INTRAVENOUS | Status: AC | PRN
  Administered 2019-11-25: 75 mL via INTRAVENOUS

## 2019-11-26 ENCOUNTER — Other Ambulatory Visit: Payer: Self-pay | Admitting: Family Medicine

## 2019-11-26 ENCOUNTER — Other Ambulatory Visit: Payer: Self-pay

## 2019-11-26 DIAGNOSIS — K118 Other diseases of salivary glands: Secondary | ICD-10-CM

## 2019-11-27 ENCOUNTER — Ambulatory Visit: Payer: BC Managed Care – PPO | Admitting: Family Medicine

## 2019-11-27 ENCOUNTER — Encounter: Payer: Self-pay | Admitting: Family Medicine

## 2019-11-27 VITALS — BP 138/76 | HR 82 | Temp 97.8°F | Ht 71.0 in | Wt 220.8 lb

## 2019-11-27 DIAGNOSIS — R220 Localized swelling, mass and lump, head: Secondary | ICD-10-CM | POA: Diagnosis not present

## 2019-11-27 DIAGNOSIS — Z8739 Personal history of other diseases of the musculoskeletal system and connective tissue: Secondary | ICD-10-CM | POA: Diagnosis not present

## 2019-11-27 DIAGNOSIS — K1120 Sialoadenitis, unspecified: Secondary | ICD-10-CM | POA: Diagnosis not present

## 2019-11-27 MED ORDER — CEPHALEXIN 500 MG PO CAPS: 500.0000 mg | ORAL_CAPSULE | Freq: Three times a day (TID) | ORAL | 0 refills | Status: AC

## 2019-11-27 MED ORDER — INDOMETHACIN 50 MG PO CAPS: 50.0000 mg | ORAL_CAPSULE | Freq: Three times a day (TID) | ORAL | 1 refills | Status: DC

## 2019-11-27 MED ORDER — COLCHICINE 0.6 MG PO TABS: ORAL_TABLET | ORAL | 2 refills | Status: DC

## 2019-11-27 NOTE — Patient Instructions (Signed)
Gout  Gout is a condition that causes painful swelling of the joints. Gout is a type of inflammation of the joints (arthritis). This condition is caused by having too much uric acid in the body. Uric acid is a chemical that forms when the body breaks down substances called purines. Purines are important for building body proteins. When the body has too much uric acid, sharp crystals can form and build up inside the joints. This causes pain and swelling. Gout attacks can happen quickly and may be very painful (acute gout). Over time, the attacks can affect more joints and become more frequent (chronic gout). Gout can also cause uric acid to build up under the skin and inside the kidneys. What are the causes? This condition is caused by too much uric acid in your blood. This can happen because:  Your kidneys do not remove enough uric acid from your blood. This is the most common cause.  Your body makes too much uric acid. This can happen with some cancers and cancer treatments. It can also occur if your body is breaking down too many red blood cells (hemolytic anemia).  You eat too many foods that are high in purines. These foods include organ meats and some seafood. Alcohol, especially beer, is also high in purines. A gout attack may be triggered by trauma or stress. What increases the risk? You are more likely to develop this condition if you:  Have a family history of gout.  Are male and middle-aged.  Are male and have gone through menopause.  Are obese.  Frequently drink alcohol, especially beer.  Are dehydrated.  Lose weight too quickly.  Have an organ transplant.  Have lead poisoning.  Take certain medicines, including aspirin, cyclosporine, diuretics, levodopa, and niacin.  Have kidney disease.  Have a skin condition called psoriasis. What are the signs or symptoms? An attack of acute gout happens quickly. It usually occurs in just one joint. The most common place is  the big toe. Attacks often start at night. Other joints that may be affected include joints of the feet, ankle, knee, fingers, wrist, or elbow. Symptoms of this condition may include:  Severe pain.  Warmth.  Swelling.  Stiffness.  Tenderness. The affected joint may be very painful to touch.  Shiny, red, or purple skin.  Chills and fever. Chronic gout may cause symptoms more frequently. More joints may be involved. You may also have white or yellow lumps (tophi) on your hands or feet or in other areas near your joints. How is this diagnosed? This condition is diagnosed based on your symptoms, medical history, and physical exam. You may have tests, such as:  Blood tests to measure uric acid levels.  Removal of joint fluid with a thin needle (aspiration) to look for uric acid crystals.  X-rays to look for joint damage. How is this treated? Treatment for this condition has two phases: treating an acute attack and preventing future attacks. Acute gout treatment may include medicines to reduce pain and swelling, including:  NSAIDs.  Steroids. These are strong anti-inflammatory medicines that can be taken by mouth (orally) or injected into a joint.  Colchicine. This medicine relieves pain and swelling when it is taken soon after an attack. It can be given by mouth or through an IV. Preventive treatment may include:  Daily use of smaller doses of NSAIDs or colchicine.  Use of a medicine that reduces uric acid levels in your blood.  Changes to your diet. You may   need to see a dietitian about what to eat and drink to prevent gout. Follow these instructions at home: During a gout attack   If directed, put ice on the affected area: ? Put ice in a plastic bag. ? Place a towel between your skin and the bag. ? Leave the ice on for 20 minutes, 2-3 times a day.  Raise (elevate) the affected joint above the level of your heart as often as possible.  Rest the joint as much as possible.  If the affected joint is in your leg, you may be given crutches to use.  Follow instructions from your health care provider about eating or drinking restrictions. Avoiding future gout attacks  Follow a low-purine diet as told by your dietitian or health care provider. Avoid foods and drinks that are high in purines, including liver, kidney, anchovies, asparagus, herring, mushrooms, mussels, and beer.  Maintain a healthy weight or lose weight if you are overweight. If you want to lose weight, talk with your health care provider. It is important that you do not lose weight too quickly.  Start or maintain an exercise program as told by your health care provider. Eating and drinking  Drink enough fluids to keep your urine pale yellow.  If you drink alcohol: ? Limit how much you use to:  0-1 drink a day for women.  0-2 drinks a day for men. ? Be aware of how much alcohol is in your drink. In the [place] one drink equals one 12 oz bottle of beer (355 mL) one 5 oz glass of wine (148 mL), or one 1 oz glass of hard liquor (44 mL). General instructions  Take over-the-counter and prescription medicines only as told by your health care provider.  Do not drive or use heavy machinery while taking prescription pain medicine.  Return to your normal activities as told by your health care provider. Ask your health care provider what activities are safe for you.  Keep all follow-up visits as told by your health care provider. This is important. Contact a health care provider if you have:  Another gout attack.  Continuing symptoms of a gout attack after 10 days of treatment.  Side effects from your medicines.  Chills or a fever.  Burning pain when you urinate.  Pain in your lower back or belly. Get help right away if you:  Have severe or uncontrolled pain.  Cannot urinate. Summary  Gout is painful swelling of the joints caused by inflammation.  The most common site of pain is the big  toe, but it can affect other joints in the body.  Medicines and dietary changes can help to prevent and treat gout attacks. This information is not intended to replace advice given to you by your health care provider. Make sure you discuss any questions you have with your health care provider. Document Revised: 10/17/2017 Document Reviewed: 10/17/2017 Elsevier Patient Education  2020 Elsevier Inc.  

## 2019-11-27 NOTE — Progress Notes (Signed)
Established Patient Office Visit  Subjective:  Patient ID: Joshua Morse, male    DOB: Sep 05, 1965  Age: 54 y.o. MRN: [id number]  CC:  Chief Complaint  Patient presents with  . Transitions Of Care    toc from Dr. Zigmund Daniel. Refill on medication. No concerns.     HPI [name] presents for follow-up of his recent CT scan for left facial mass.  Also history of relatively infrequent gout flares.  Attacks are usually less than once or twice a year.  Colchicine alone as prescribed has been quite effective.  Last uric acid measured recently was 6.8.  History of left-sided facial mass.  Was sent for CT scan.  Would like to discuss the results.  No past medical history on file.  Past Surgical History:  Procedure Laterality Date  . TONSILLECTOMY      Family History  Problem Relation Age of Onset  . Gout Maternal Grandmother   . Gout Paternal Grandfather     Social History   Socioeconomic History  . Marital status: Married    Spouse name: Not on file  . Number of children: Not on file  . Years of education: Not on file  . Highest education level: Not on file  Occupational History  . Not on file  Tobacco Use  . Smoking status: Former Research scientist (life sciences)  . Smokeless tobacco: Never Used  Vaping Use  . Vaping Use: Never used  Substance and Sexual Activity  . Alcohol use: Yes    Comment: ocass  . Drug use: Never  . Sexual activity: Not on file  Other Topics Concern  . Not on file  Social History Narrative  . Not on file   Social Determinants of Health   Financial Resource Strain:   . Difficulty of Paying Living Expenses: Not on file  Food Insecurity:   . Worried About Charity fundraiser in the Last Year: Not on file  . Ran Out of Food in the Last Year: Not on file  Transportation Needs:   . Lack of Transportation (Medical): Not on file  . Lack of Transportation (Non-Medical): Not on file  Physical Activity:   . Days of Exercise per Week: Not on file  . Minutes of Exercise  per Session: Not on file  Stress:   . Feeling of Stress : Not on file  Social Connections:   . Frequency of Communication with Friends and Family: Not on file  . Frequency of Social Gatherings with Friends and Family: Not on file  . Attends Religious Services: Not on file  . Active Member of Clubs or Organizations: Not on file  . Attends Archivist Meetings: Not on file  . Marital Status: Not on file  Intimate Partner Violence:   . Fear of Current or Ex-Partner: Not on file  . Emotionally Abused: Not on file  . Physically Abused: Not on file  . Sexually Abused: Not on file    Outpatient Medications Prior to Visit  Medication Sig Dispense Refill  . colchicine 0.6 MG tablet Take 1.2mg  initially followed by 0.6mg  1 hour later.  Continue 0.6mg  daily until resolution 30 tablet 2  . triamcinolone cream (KENALOG) 0.1 % APP EXT AA BID (Patient not taking: Reported on 11/05/2019)     No facility-administered medications prior to visit.    No Known Allergies  ROS Review of Systems  Constitutional: Negative for diaphoresis, fatigue, fever and unexpected weight change.  HENT: Negative.   Eyes: Negative for photophobia and  visual disturbance.  Respiratory: Negative for chest tightness, shortness of breath and wheezing.   Cardiovascular: Negative.   Gastrointestinal: Negative.   Endocrine: Negative for polyphagia and polyuria.  Genitourinary: Negative.   Musculoskeletal: Negative for arthralgias and myalgias.  Allergic/Immunologic: Negative for immunocompromised state.  Neurological: Negative for weakness.  Hematological: Does not bruise/bleed easily.  Psychiatric/Behavioral: Negative.       Objective:    Physical Exam Vitals reviewed.  Constitutional:      General: He is not in acute distress.    Appearance: Normal appearance. He is normal weight. He is not ill-appearing, toxic-appearing or diaphoretic.  HENT:     Head: Normocephalic and atraumatic.     Right Ear:  Tympanic membrane, ear canal and external ear normal.     Left Ear: Tympanic membrane, ear canal and external ear normal.     Mouth/Throat:     Mouth: Mucous membranes are moist.     Pharynx: Oropharynx is clear. No oropharyngeal exudate or posterior oropharyngeal erythema.   Eyes:     General:        Right eye: No discharge.        Left eye: No discharge.     Extraocular Movements: Extraocular movements intact.     Conjunctiva/sclera: Conjunctivae normal.     Pupils: Pupils are equal, round, and reactive to light.  Neck:   Pulmonary:     Effort: Pulmonary effort is normal.  Skin:    General: Skin is warm and dry.  Neurological:     Mental Status: He is alert and oriented to person, place, and time.  Psychiatric:        Mood and Affect: Mood normal.        Behavior: Behavior normal.     BP 138/76   Pulse 82   Temp 97.8 F (36.6 C) (Tympanic)   Ht 5\' 11"  (1.803 m)   Wt 220 lb 12.8 oz (100.2 kg)   SpO2 97%   BMI 30.80 kg/m  Wt Readings from Last 3 Encounters:  11/27/19 220 lb 12.8 oz (100.2 kg)  11/05/19 (!) 222 lb 3.2 oz (100.8 kg)  10/14/18 224 lb (101.6 kg)     Health Maintenance Due  Topic Date Due  . Hepatitis C Screening  Never done  . HIV Screening  Never done  . INFLUENZA VACCINE  11/09/2019    There are no preventive care reminders to display for this patient.  Lab Results  Component Value Date   TSH 1.68 04/16/2018   Lab Results  Component Value Date   WBC 10.8 (H) 11/05/2019   HGB 15.9 11/05/2019   HCT 46.7 11/05/2019   MCV 99.4 11/05/2019   PLT 228.0 11/05/2019   Lab Results  Component Value Date   NA 139 11/05/2019   K 3.8 11/05/2019   CO2 25 11/05/2019   GLUCOSE 118 (H) 11/05/2019   BUN 11 11/05/2019   CREATININE 1.00 11/05/2019   BILITOT 0.5 04/16/2018   ALKPHOS 113 04/16/2018   AST 23 11/05/2019   ALT 32 11/05/2019   PROT 6.8 04/16/2018   ALBUMIN 4.3 04/16/2018   CALCIUM 9.4 11/05/2019   GFR 77.25 11/05/2019   Lab Results    Component Value Date   CHOL 183 11/05/2019   Lab Results  Component Value Date   HDL 41.40 11/05/2019   Lab Results  Component Value Date   LDLCALC 149 (H) 04/16/2018   Lab Results  Component Value Date   TRIG 220.0 (H) 11/05/2019  Lab Results  Component Value Date   CHOLHDL 4 11/05/2019   Lab Results  Component Value Date   HGBA1C 6.1 11/05/2019      Assessment & Plan:   Problem List Items Addressed This Visit      Digestive   Sialadenitis   Relevant Medications   cephALEXin (KEFLEX) 500 MG capsule     Other   Left facial swelling - Primary   History of gout   Relevant Medications   indomethacin (INDOCIN) 50 MG capsule   colchicine 0.6 MG tablet      Meds ordered this encounter  Medications  . cephALEXin (KEFLEX) 500 MG capsule    Sig: Take 1 capsule (500 mg total) by mouth 3 (three) times daily for 10 days.    Dispense:  30 capsule    Refill:  0  . indomethacin (INDOCIN) 50 MG capsule    Sig: Take 1 capsule (50 mg total) by mouth 3 (three) times daily with meals. As needed for gouty attacks.    Dispense:  30 capsule    Refill:  1  . colchicine 0.6 MG tablet    Sig: Take 1.2mg  initially followed by 0.6mg  1 hour later.  Continue 0.6mg  daily until resolution    Dispense:  30 tablet    Refill:  2    Follow-up: Return Follow up with ent being scheduled. Call in one week if appointment has not been scheduled..   Patient was given information on gout.  Discussed the sialoadenitis use of allopurinol with increased frequency of attacks.  He is doing well treating them episodically at this point.  Discussed results of CT scan.  He is aware of that mass of salivary gland versus malignant lymph node are possibilities.  ENT referral has been requested.  We will treat with Keflex for possible sialadenitis.  He will call us in 1 week if he has not received an appointment. Libby Maw, MD

## 2019-12-01 DIAGNOSIS — J359 Chronic disease of tonsils and adenoids, unspecified: Secondary | ICD-10-CM | POA: Diagnosis not present

## 2019-12-01 DIAGNOSIS — D37030 Neoplasm of uncertain behavior of the parotid salivary glands: Secondary | ICD-10-CM | POA: Diagnosis not present

## 2019-12-01 DIAGNOSIS — D3705 Neoplasm of uncertain behavior of pharynx: Secondary | ICD-10-CM | POA: Diagnosis not present

## 2019-12-01 DIAGNOSIS — J330 Polyp of nasal cavity: Secondary | ICD-10-CM | POA: Diagnosis not present

## 2019-12-02 ENCOUNTER — Other Ambulatory Visit: Payer: Self-pay

## 2019-12-02 ENCOUNTER — Other Ambulatory Visit: Payer: Self-pay | Admitting: Otolaryngology

## 2019-12-02 ENCOUNTER — Encounter (HOSPITAL_BASED_OUTPATIENT_CLINIC_OR_DEPARTMENT_OTHER): Payer: Self-pay | Admitting: Otolaryngology

## 2019-12-05 ENCOUNTER — Other Ambulatory Visit (HOSPITAL_COMMUNITY)
Admission: RE | Admit: 2019-12-05 | Discharge: 2019-12-05 | Disposition: A | Discharge status: Home / Self Care | Payer: BC Managed Care – PPO | Source: Ambulatory Visit | Attending: Otolaryngology | Admitting: Otolaryngology

## 2019-12-05 DIAGNOSIS — Z20822 Contact with and (suspected) exposure to covid-19: Secondary | ICD-10-CM | POA: Diagnosis not present

## 2019-12-05 DIAGNOSIS — Z01812 Encounter for preprocedural laboratory examination: Secondary | ICD-10-CM | POA: Insufficient documentation

## 2019-12-05 LAB — SARS CORONAVIRUS 2 (TAT 6-24 HRS): SARS Coronavirus 2: NEGATIVE

## 2019-12-09 ENCOUNTER — Ambulatory Visit (HOSPITAL_BASED_OUTPATIENT_CLINIC_OR_DEPARTMENT_OTHER): Payer: BC Managed Care – PPO | Admitting: Certified Registered"

## 2019-12-09 ENCOUNTER — Encounter (HOSPITAL_BASED_OUTPATIENT_CLINIC_OR_DEPARTMENT_OTHER): Payer: Self-pay | Admitting: Otolaryngology

## 2019-12-09 ENCOUNTER — Encounter (HOSPITAL_BASED_OUTPATIENT_CLINIC_OR_DEPARTMENT_OTHER)
Admission: RE | Disposition: A | Discharge status: Home / Self Care | Payer: Self-pay | Source: Home / Self Care | Attending: Otolaryngology

## 2019-12-09 ENCOUNTER — Ambulatory Visit (HOSPITAL_BASED_OUTPATIENT_CLINIC_OR_DEPARTMENT_OTHER)
Admission: RE | Admit: 2019-12-09 | Discharge: 2019-12-10 | Disposition: A | Discharge status: Home / Self Care | Payer: BC Managed Care – PPO | Attending: Otolaryngology | Admitting: Otolaryngology

## 2019-12-09 ENCOUNTER — Other Ambulatory Visit: Payer: Self-pay

## 2019-12-09 DIAGNOSIS — Z79899 Other long term (current) drug therapy: Secondary | ICD-10-CM | POA: Insufficient documentation

## 2019-12-09 DIAGNOSIS — K219 Gastro-esophageal reflux disease without esophagitis: Secondary | ICD-10-CM | POA: Insufficient documentation

## 2019-12-09 DIAGNOSIS — K116 Mucocele of salivary gland: Secondary | ICD-10-CM | POA: Diagnosis not present

## 2019-12-09 DIAGNOSIS — Z9049 Acquired absence of other specified parts of digestive tract: Secondary | ICD-10-CM

## 2019-12-09 DIAGNOSIS — D37030 Neoplasm of uncertain behavior of the parotid salivary glands: Secondary | ICD-10-CM | POA: Diagnosis not present

## 2019-12-09 DIAGNOSIS — Z87891 Personal history of nicotine dependence: Secondary | ICD-10-CM | POA: Diagnosis not present

## 2019-12-09 DIAGNOSIS — E7800 Pure hypercholesterolemia, unspecified: Secondary | ICD-10-CM | POA: Diagnosis not present

## 2019-12-09 DIAGNOSIS — J339 Nasal polyp, unspecified: Secondary | ICD-10-CM | POA: Diagnosis not present

## 2019-12-09 DIAGNOSIS — K098 Other cysts of oral region, not elsewhere classified: Secondary | ICD-10-CM | POA: Diagnosis not present

## 2019-12-09 DIAGNOSIS — K118 Other diseases of salivary glands: Secondary | ICD-10-CM | POA: Diagnosis not present

## 2019-12-09 HISTORY — DX: Gout, unspecified: M10.9

## 2019-12-09 HISTORY — DX: Other diseases of salivary glands: K11.8

## 2019-12-09 HISTORY — PX: PAROTIDECTOMY: SHX2163

## 2019-12-09 SURGERY — EXCISION, PAROTID GLAND
Anesthesia: General | Site: Face | Laterality: Left

## 2019-12-09 MED ORDER — SODIUM CHLORIDE 0.9 % IV SOLN
INTRAVENOUS | Status: DC | PRN
  Administered 2019-12-09: 40 ug/min via INTRAVENOUS

## 2019-12-09 MED ORDER — PROPOFOL 500 MG/50ML IV EMUL
INTRAVENOUS | Status: DC | PRN
  Administered 2019-12-09: 75 ug/kg/min via INTRAVENOUS

## 2019-12-09 MED ORDER — BACITRACIN ZINC 500 UNIT/GM EX OINT
TOPICAL_OINTMENT | CUTANEOUS | Status: AC
  Filled 2019-12-09: qty 0.9

## 2019-12-09 MED ORDER — OXYCODONE HCL 5 MG/5ML PO SOLN: 5.0000 mg | Freq: Once | ORAL | Status: DC | PRN

## 2019-12-09 MED ORDER — ONDANSETRON HCL 4 MG/2ML IJ SOLN
INTRAMUSCULAR | Status: AC
  Filled 2019-12-09: qty 2

## 2019-12-09 MED ORDER — ROCURONIUM BROMIDE 10 MG/ML (PF) SYRINGE
PREFILLED_SYRINGE | INTRAVENOUS | Status: AC
  Filled 2019-12-09: qty 10

## 2019-12-09 MED ORDER — SUCCINYLCHOLINE CHLORIDE 20 MG/ML IJ SOLN
INTRAMUSCULAR | Status: DC | PRN
  Administered 2019-12-09: 160 mg via INTRAVENOUS

## 2019-12-09 MED ORDER — EPHEDRINE SULFATE 50 MG/ML IJ SOLN
INTRAMUSCULAR | Status: DC | PRN
  Administered 2019-12-09 (×2): 10 mg via INTRAVENOUS

## 2019-12-09 MED ORDER — FENTANYL CITRATE (PF) 100 MCG/2ML IJ SOLN
INTRAMUSCULAR | Status: AC
  Filled 2019-12-09: qty 2

## 2019-12-09 MED ORDER — FENTANYL CITRATE (PF) 100 MCG/2ML IJ SOLN
INTRAMUSCULAR | Status: DC | PRN
Start: 2019-12-09 — End: 2019-12-09
  Administered 2019-12-09: 25 ug via INTRAVENOUS
  Administered 2019-12-09: 50 ug via INTRAVENOUS
  Administered 2019-12-09 (×2): 25 ug via INTRAVENOUS
  Administered 2019-12-09 (×2): 50 ug via INTRAVENOUS
  Administered 2019-12-09: 100 ug via INTRAVENOUS
  Administered 2019-12-09: 25 ug via INTRAVENOUS

## 2019-12-09 MED ORDER — OXYCODONE HCL 5 MG PO TABS: 5.0000 mg | ORAL_TABLET | Freq: Once | ORAL | Status: DC | PRN

## 2019-12-09 MED ORDER — AMOXICILLIN 875 MG PO TABS: 875.0000 mg | ORAL_TABLET | Freq: Two times a day (BID) | ORAL | 0 refills | Status: AC

## 2019-12-09 MED ORDER — MORPHINE SULFATE (PF) 4 MG/ML IV SOLN: 2.0000 mg | INTRAVENOUS | Status: DC | PRN

## 2019-12-09 MED ORDER — KCL IN DEXTROSE-NACL 20-5-0.45 MEQ/L-%-% IV SOLN: INTRAVENOUS | Status: DC

## 2019-12-09 MED ORDER — HYDROMORPHONE HCL 1 MG/ML IJ SOLN: 0.2500 mg | INTRAMUSCULAR | Status: DC | PRN

## 2019-12-09 MED ORDER — DEXAMETHASONE SODIUM PHOSPHATE 4 MG/ML IJ SOLN
INTRAMUSCULAR | Status: DC | PRN
  Administered 2019-12-09: 10 mg via INTRAVENOUS

## 2019-12-09 MED ORDER — OXYCODONE-ACETAMINOPHEN 5-325 MG PO TABS: 1.0000 | ORAL_TABLET | ORAL | 0 refills | Status: AC | PRN

## 2019-12-09 MED ORDER — LIDOCAINE-EPINEPHRINE 1 %-1:100000 IJ SOLN
INTRAMUSCULAR | Status: DC | PRN
  Administered 2019-12-09: 6 mL

## 2019-12-09 MED ORDER — PROMETHAZINE HCL 25 MG/ML IJ SOLN: 6.2500 mg | INTRAMUSCULAR | Status: DC | PRN

## 2019-12-09 MED ORDER — ONDANSETRON HCL 4 MG/2ML IJ SOLN
INTRAMUSCULAR | Status: DC | PRN
  Administered 2019-12-09: 4 mg via INTRAVENOUS

## 2019-12-09 MED ORDER — PROPOFOL 10 MG/ML IV BOLUS
INTRAVENOUS | Status: DC | PRN
  Administered 2019-12-09: 200 mg via INTRAVENOUS

## 2019-12-09 MED ORDER — LACTATED RINGERS IV SOLN: INTRAVENOUS | Status: DC

## 2019-12-09 MED ORDER — MIDAZOLAM HCL 2 MG/2ML IJ SOLN
INTRAMUSCULAR | Status: AC
  Filled 2019-12-09: qty 2

## 2019-12-09 MED ORDER — MIDAZOLAM HCL 2 MG/2ML IJ SOLN: 0.5000 mg | Freq: Once | INTRAMUSCULAR | Status: DC | PRN

## 2019-12-09 MED ORDER — LIDOCAINE-EPINEPHRINE 1 %-1:100000 IJ SOLN
INTRAMUSCULAR | Status: AC
  Filled 2019-12-09: qty 1

## 2019-12-09 MED ORDER — SODIUM CHLORIDE 0.9 % IV SOLN
INTRAVENOUS | Status: DC | PRN
  Administered 2019-12-09: 80 ug via INTRAVENOUS
  Administered 2019-12-09: 120 ug via INTRAVENOUS
  Administered 2019-12-09 (×2): 80 ug via INTRAVENOUS
  Administered 2019-12-09: 120 ug via INTRAVENOUS

## 2019-12-09 MED ORDER — LIDOCAINE HCL (CARDIAC) PF 100 MG/5ML IV SOSY
PREFILLED_SYRINGE | INTRAVENOUS | Status: DC | PRN
  Administered 2019-12-09: 100 mg via INTRAVENOUS

## 2019-12-09 MED ORDER — OXYCODONE-ACETAMINOPHEN 5-325 MG PO TABS
1.0000 | ORAL_TABLET | ORAL | Status: DC | PRN
  Administered 2019-12-09 – 2019-12-10 (×3): 2 via ORAL
  Filled 2019-12-09 (×3): qty 2

## 2019-12-09 MED ORDER — DEXAMETHASONE SODIUM PHOSPHATE 10 MG/ML IJ SOLN
INTRAMUSCULAR | Status: AC
  Filled 2019-12-09: qty 1

## 2019-12-09 MED ORDER — LIDOCAINE 2% (20 MG/ML) 5 ML SYRINGE
INTRAMUSCULAR | Status: AC
  Filled 2019-12-09: qty 5

## 2019-12-09 MED ORDER — ACETAMINOPHEN 500 MG PO TABS
1000.0000 mg | ORAL_TABLET | Freq: Once | ORAL | Status: AC
  Administered 2019-12-09: 1000 mg via ORAL

## 2019-12-09 MED ORDER — MEPERIDINE HCL 25 MG/ML IJ SOLN: 6.2500 mg | INTRAMUSCULAR | Status: DC | PRN

## 2019-12-09 MED ORDER — ACETAMINOPHEN 500 MG PO TABS
ORAL_TABLET | ORAL | Status: AC
  Filled 2019-12-09: qty 2

## 2019-12-09 MED ORDER — PROPOFOL 10 MG/ML IV BOLUS
INTRAVENOUS | Status: AC
  Filled 2019-12-09: qty 40

## 2019-12-09 MED ORDER — MIDAZOLAM HCL 5 MG/5ML IJ SOLN
INTRAMUSCULAR | Status: DC | PRN
  Administered 2019-12-09: 2 mg via INTRAVENOUS

## 2019-12-09 SURGICAL SUPPLY — 59 items
APPLICATOR DR MATTHEWS STRL (MISCELLANEOUS) ×2 IMPLANT
ATTRACTOMAT 16X20 MAGNETIC DRP (DRAPES) IMPLANT
BLADE SURG 15 STRL LF DISP TIS (BLADE) ×1 IMPLANT
BLADE SURG 15 STRL SS (BLADE) ×1
CANISTER SUCT 1200ML W/VALVE (MISCELLANEOUS) ×2 IMPLANT
CORD BIPOLAR FORCEPS 12FT (ELECTRODE) ×2 IMPLANT
COTTONBALL LRG STERILE PKG (GAUZE/BANDAGES/DRESSINGS) ×2 IMPLANT
COVER BACK TABLE 60X90IN (DRAPES) ×2 IMPLANT
COVER MAYO STAND STRL (DRAPES) ×2 IMPLANT
COVER WAND RF STERILE (DRAPES) IMPLANT
DECANTER SPIKE VIAL GLASS SM (MISCELLANEOUS) ×2 IMPLANT
DERMABOND ADVANCED (GAUZE/BANDAGES/DRESSINGS) ×1
DERMABOND ADVANCED .7 DNX12 (GAUZE/BANDAGES/DRESSINGS) ×1 IMPLANT
DRAIN CHANNEL 10F 3/8 F FF (DRAIN) ×2 IMPLANT
DRAPE SURG 17X23 STRL (DRAPES) IMPLANT
DRAPE U-SHAPE 76X120 STRL (DRAPES) ×2 IMPLANT
ELECT COATED BLADE 2.86 ST (ELECTRODE) ×2 IMPLANT
ELECT PAIRED SUBDERMAL (MISCELLANEOUS) ×2
ELECT REM PT RETURN 9FT ADLT (ELECTROSURGICAL) ×2
ELECTRODE PAIRED SUBDERMAL (MISCELLANEOUS) ×1 IMPLANT
ELECTRODE REM PT RTRN 9FT ADLT (ELECTROSURGICAL) ×1 IMPLANT
EVACUATOR SILICONE 100CC (DRAIN) ×2 IMPLANT
FORCEPS BIPOLAR SPETZLER 8 1.0 (NEUROSURGERY SUPPLIES) ×2 IMPLANT
GAUZE 4X4 16PLY RFD (DISPOSABLE) ×2 IMPLANT
GLOVE BIO SURGEON STRL SZ 6.5 (GLOVE) ×4 IMPLANT
GLOVE BIO SURGEON STRL SZ7.5 (GLOVE) ×2 IMPLANT
GLOVE BIOGEL PI IND STRL 6.5 (GLOVE) ×1 IMPLANT
GLOVE BIOGEL PI IND STRL 7.0 (GLOVE) ×2 IMPLANT
GLOVE BIOGEL PI INDICATOR 6.5 (GLOVE) ×1
GLOVE BIOGEL PI INDICATOR 7.0 (GLOVE) ×2
GOWN STRL REUS W/ TWL LRG LVL3 (GOWN DISPOSABLE) ×4 IMPLANT
GOWN STRL REUS W/TWL LRG LVL3 (GOWN DISPOSABLE) ×4
LOCATOR NERVE 3 VOLT (DISPOSABLE) IMPLANT
NEEDLE HYPO 25X1 1.5 SAFETY (NEEDLE) ×2 IMPLANT
NS IRRIG 1000ML POUR BTL (IV SOLUTION) ×2 IMPLANT
PACK BASIN DAY SURGERY FS (CUSTOM PROCEDURE TRAY) ×2 IMPLANT
PAD ALCOHOL SWAB (MISCELLANEOUS) ×4 IMPLANT
PENCIL SMOKE EVACUATOR (MISCELLANEOUS) ×2 IMPLANT
PIN SAFETY STERILE (MISCELLANEOUS) IMPLANT
PROBE NERVBE PRASS .33 (MISCELLANEOUS) ×2 IMPLANT
SHEARS HARMONIC 9CM CVD (BLADE) ×2 IMPLANT
SLEEVE SCD COMPRESS KNEE MED (MISCELLANEOUS) ×2 IMPLANT
SPONGE INTESTINAL PEANUT (DISPOSABLE) ×4 IMPLANT
STAPLER VISISTAT 35W (STAPLE) IMPLANT
SUT ETHILON 3 0 PS 1 (SUTURE) ×2 IMPLANT
SUT PROLENE 5 0 PS 2 (SUTURE) ×2 IMPLANT
SUT SILK 2 0 PERMA HAND 18 BK (SUTURE) ×6 IMPLANT
SUT SILK 2 0 TIES 17X18 (SUTURE)
SUT SILK 2-0 18XBRD TIE BLK (SUTURE) IMPLANT
SUT SILK 3 0 TIES 17X18 (SUTURE) ×1
SUT SILK 3-0 18XBRD TIE BLK (SUTURE) ×1 IMPLANT
SUT SILK 4 0 TIES 17X18 (SUTURE) IMPLANT
SUT VIC AB 3-0 FS2 27 (SUTURE) IMPLANT
SUT VICRYL 4-0 PS2 18IN ABS (SUTURE) ×2 IMPLANT
SYR BULB EAR ULCER 3OZ GRN STR (SYRINGE) ×2 IMPLANT
SYR CONTROL 10ML LL (SYRINGE) ×2 IMPLANT
TOWEL GREEN STERILE FF (TOWEL DISPOSABLE) ×2 IMPLANT
TRAY DSU PREP LF (CUSTOM PROCEDURE TRAY) ×2 IMPLANT
TUBE CONNECTING 20X1/4 (TUBING) ×2 IMPLANT

## 2019-12-09 NOTE — Progress Notes (Signed)
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 Patient does not have any weakness. Patients face is symmetrical. Patient able to stick out tongue, raise eyebrows, and smile. No new orders at this time. Will continue to monitor patient.Setzer, Marchelle Folks

## 2019-12-09 NOTE — Discharge Instructions (Signed)
Parotidectomy, Care After This sheet gives you information about how to care for yourself after your procedure. Your health care provider may also give you more specific instructions. If you have problems or questions, contact your health care provider. What can I expect after the procedure? After the procedure, it is common to have:  Pain and mild swelling at the incision site.  Numbness along the incision.  Numbness in part or all of your ear.  Mild jaw discomfort on the surgical side when you are eating or chewing. This may last up to 2-4 [place]. Follow these instructions at home: Medicines   Take over-the-counter and prescription medicines only as told by your health care provider.  If you were prescribed an antibiotic medicine, take it as told by your health care provider. Do not stop taking the antibiotic even if you start to feel better.  Ask your health care provider if the medicine prescribed to you: ? Requires you to avoid driving or using heavy machinery. ? Can cause constipation. You may need to take actions to prevent or treat constipation, such as:  Drink enough fluid to keep your urine pale yellow.  Take over-the-counter or prescription medicines.  Eat foods that are high in fiber, such as beans, whole grains, and fresh fruits and vegetables.  Limit foods that are high in fat and processed sugars, such as fried or sweet foods. Incision care   Follow instructions from your health care provider about how to take care of your incision. Make sure you: ? Wash your hands with soap and water before and after you change your bandage (dressing). If soap and water are not available, use hand sanitizer. ? Leave stitches (sutures), skin glue, or adhesive strips in place. These skin closures may need to be in place for 2 [place] or longer. If adhesive strip edges start to loosen and curl up, you may trim the loose edges. Do not remove adhesive strips completely unless your health  care provider tells you to do that.  Check your incision area every day for signs of infection. Check for: ? More redness, swelling, or pain. ? Fluid or blood. ? Warmth. ? Pus or a bad smell.  Follow your health care provider's instructions about cleaning and maintaining the drain that was placed near your incision. Eating and drinking  Follow instructions from your health care provider about eating or drinking restrictions.  If your mouth or jaw is sore, try eating soft foods until you feel better. Activity  Return to your normal activities as told by your health care provider. Ask your health care provider what activities are safe for you.  Rest as told by your health care provider.  Avoid sitting for a long time without moving. Get up to take short walks every 1-2 hours. This is important to improve blood flow and breathing. Ask for help if you feel weak or unsteady.  Do not lift anything that is heavier than 10 lb (4.5 kg), or the limit that you are told, until your health care provider says that it is safe. General instructions  Keep your head raised (elevated) when you lie down during the first few [place] after surgery. This will help prevent increased swelling.  Keep all follow-up visits as told by your health care provider. This is important. Contact a health care provider if:  You have pain that does not get better with medicine.  You have more redness, swelling, or pain around your incision.  You have fluid or  blood coming from your incision.  Your incision feels warm to the touch.  You have pus or a bad smell coming from your incision.  You vomit or feel nauseous.  You have a fever. Get help right away if:  You have more pain, swelling, or redness that suddenly gets worse at the incision site.  You have increasing numbness or weakness in your face.  You have severe pain. Summary  After the procedure, it is common to have mild jaw discomfort on the  surgical side when you are eating or chewing. This may last up to 2-4 [place].  Follow instructions from your health care provider about how to take care of your incision.  If your mouth or jaw is sore, try eating soft foods until you feel better.  Return to your normal activities as told by your health care provider. Ask your health care provider what activities are safe for you. This information is not intended to replace advice given to you by your health care provider. Make sure you discuss any questions you have with your health care provider. Document Revised: 01/14/2018 Document Reviewed: 01/16/2018 Elsevier Patient Education  [place].

## 2019-12-09 NOTE — Anesthesia Postprocedure Evaluation (Signed)
Anesthesia Post Note  Patient: Joshua Morse  Procedure(s) Performed: LEFT TOTALPAROTIDECTOMY (Left Face)     Patient location during evaluation: PACU Anesthesia Type: General Level of consciousness: awake and alert, patient cooperative and oriented Pain management: pain level controlled Vital Signs Assessment: post-procedure vital signs reviewed and stable Respiratory status: spontaneous breathing, nonlabored ventilation and respiratory function stable Cardiovascular status: blood pressure returned to baseline and stable Postop Assessment: no apparent nausea or vomiting Anesthetic complications: no   No complications documented.  Last Vitals:  Vitals:   12/09/19 1200 12/09/19 1208  BP: 131/87 118/79  Pulse: (!) 103   Resp: 19 16  Temp:  36.8 C  SpO2: 95% 92%    Last Pain:  Vitals:   12/09/19 1208  TempSrc:   PainSc: 0-No pain                 Niklas Chretien,E. Keiarra Charon

## 2019-12-09 NOTE — Anesthesia Procedure Notes (Signed)
Procedure Name: Intubation Performed by: Verita Lamb, CRNA Pre-anesthesia Checklist: Patient identified, Emergency Drugs available, Suction available and Patient being monitored Patient Re-evaluated:Patient Re-evaluated prior to induction Oxygen Delivery Method: Circle system utilized Preoxygenation: Pre-oxygenation with 100% oxygen Induction Type: IV induction Ventilation: Mask ventilation without difficulty Laryngoscope Size: Mac and 4 Grade View: Grade III Tube type: Oral Number of attempts: 1 Airway Equipment and Method: Stylet and Oral airway Placement Confirmation: ETT inserted through vocal cords under direct vision,  positive ETCO2 and breath sounds checked- equal and bilateral Secured at: 22 cm Tube secured with: Tape Dental Injury: Teeth and Oropharynx as per pre-operative assessment  Comments: Teet and lips as preop, smooth iv induction. Easy mask vent with oral airway, view of the epiglottis only without cricoid, with cricoid could visualize base of glottic opening.

## 2019-12-09 NOTE — Anesthesia Preprocedure Evaluation (Addendum)
Anesthesia Evaluation  Patient identified by MRN, date of birth, ID band Patient awake    Reviewed: Allergy & Precautions, NPO status , Patient's Chart, lab work & pertinent test results  History of Anesthesia Complications Negative for: history of anesthetic complications  Airway Mallampati: II  TM Distance: >3 FB Neck ROM: Full    Dental  (+) Dental Advisory Given, Teeth Intact   Pulmonary former smoker,  12/05/2019 SARS coronavirus NEG   breath sounds clear to auscultation       Cardiovascular negative cardio ROS   Rhythm:Regular Rate:Normal     Neuro/Psych negative neurological ROS     GI/Hepatic Neg liver ROS, GERD  Poorly Controlled,  Endo/Other  negative endocrine ROS  Renal/GU negative Renal ROS     Musculoskeletal   Abdominal   Peds  Hematology negative hematology ROS (+)   Anesthesia Other Findings Parotid mass  Reproductive/Obstetrics                            Anesthesia Physical Anesthesia Plan  ASA: II  Anesthesia Plan: General   Post-op Pain Management:    Induction: Intravenous and Rapid sequence  PONV Risk Score and Plan: 2 and Ondansetron and Dexamethasone  Airway Management Planned: Oral ETT  Additional Equipment: None  Intra-op Plan:   Post-operative Plan: Extubation in OR  Informed Consent: I have reviewed the patients History and Physical, chart, labs and discussed the procedure including the risks, benefits and alternatives for the proposed anesthesia with the patient or authorized representative who has indicated his/her understanding and acceptance.     Dental advisory given  Plan Discussed with: CRNA and Surgeon  Anesthesia Plan Comments:        Anesthesia Quick Evaluation

## 2019-12-09 NOTE — H&P (Signed)
Cc: Left parotid mass  HPI: The patient is a 54 y/o male who presents today for evaluation of a left parotid mass. The patient is seen in consultation requested by [facility]. The patient first noted the mass over a month ago. He has not noted any change in the size of the lesion. The patient had a CT which showed a 4.7 cm left parotid mass. Also noted was soft tissue fullness at the hypopharynx at the tongue base. The patient denies throat pain or dysphagia. He is a previous smoker. Previous ENT surgery is denied.   The patient's review of systems (constitutional, eyes, ENT, cardiovascular, respiratory, GI, musculoskeletal, skin, neurologic, psychiatric, endocrine, hematologic, allergic) is noted in the ROS questionnaire.  It is reviewed with the patient.   Family health history: None.  Major events: None.  Ongoing medical problems: None.  Social history: The patient is married. He is a former smoker. He drinks alcohol on the weekends. He denies the use of illegal drugs.   Exam: General: Communicates without difficulty, well nourished, no acute distress. Head: Normocephalic, no evidence injury, no tenderness, facial buttresses intact without stepoff. Eyes: PERRL, EOMI. No scleral icterus, conjunctivae clear. Neuro: CN II exam reveals vision grossly intact.  No nystagmus at any point of gaze. Ears: External auditory canals clear bilaterally.  There is no edema or erythema.  Tympanic membrane is within normal limits bilaterally. Nose: Normal skin and external support.  Anterior rhinoscopy reveals congested mucosa over the septum and turbinates.  Oral cavity: Lips without lesions, oral mucosa moist, no masses or lesions seen. Indirect  mirror laryngoscopy could not be tolerated. Pharynx: Clear, no erythema. Neck: Supple, full range of motion, no lymphadenopathy, no masses palpable. Salivary: Submandibular glands without mass. Large left parotid mass. Neuro:  CN 2-12 grossly intact. Gait normal.  Vestibular: No nystagmus at any point of gaze.   Procedure:  Flexible Fiberoptic Laryngoscopy -- Risks, benefits, and alternatives of flexible endoscopy were explained to the patient.  Specific mention was made of the risk of throat numbness with difficulty swallowing, possible bleeding from the nose and mouth, and pain from the procedure.  The patient gave oral consent to proceed.  The nasal cavities were decongested and anesthetised with a combination of oxymetazoline and 4% lidocaine solution.  The flexible scope was inserted into the right nasal cavity and advanced towards the nasopharynx.  Visualized mucosa over the turbinates and septum were as described above.  Several nasal polyps noted. The nasopharynx was clear.  Oropharyngeal walls were symmetric and mobile without lesion, mass, or edema.  Hypopharynx was also without  lesion or edema.  Larynx was mobile without lesions. Supraglottic structures were free of edema, mass, and asymmetry.  True vocal folds were white without mass or lesion.  Base of tongue was noted with lingual tonsils.  The patient tolerated the procedure well.   Assessment 1. A 4.7 cm left parotid mass is noted.  2. Lingual tonsils correlate with the soft tissue fullness noted on CT at the hypopharynx . 3. Right nasal polyposis. No other suspicious mass or lesion is noted on endoscopy exam.  Plan 1. The physical exam, CT, and endoscopy findings are extensively reviewed with the patient.  2.  Flonase 2 sprays each side QD. The proper technique of applying nasal spray and the importance of using the spray daily is discussed with the patient.  3. The patient would benefit from left parotidectomy. The risks, benefits, alternatives, and details of the procedure are reviewed  with the patient. Questions are invited and answered. 4. The patient is interested in proceeding with the procedure.  We will schedule the procedure in accordance with the family schedule.

## 2019-12-09 NOTE — Op Note (Signed)
DATE OF PROCEDURE:  12/09/2019                              OPERATIVE REPORT  SURGEON:  Leta Baptist, MD  PREOPERATIVE DIAGNOSES: 1. Left parotid mass  POSTOPERATIVE DIAGNOSES: 1. Left parotid mass  PROCEDURE PERFORMED: Left total parotidectomy with dissection and preservation of facial nerve.  ANESTHESIA:  General endotracheal tube anesthesia.  COMPLICATIONS:  None.  ESTIMATED BLOOD LOSS:  25 ml.  INDICATION FOR PROCEDURE:  Joshua Morse is a 54 y.o. male with a history of a large left parotid mass.  His CT scan showed a 4.7 centimeter left parotid mass. Top differential considerations include cystic salivary gland neoplasm, necrotic/malignant lymph node, and branchial cleft cyst.  No significant lymphadenopathy was noted on his CT scan. Based on the above findings, the decision was made for the patient to undergo the surgical excision procedure.  The risks, benefits, alternatives, and details of the procedure were discussed with the patient.  Questions were invited and answered.  Informed consent was obtained.  DESCRIPTION:  The patient was taken to the operating room and placed supine on the operating table.  General endotracheal tube anesthesia was administered by the anesthesiologist.  The patient was positioned and prepped and draped in a standard fashion for left parotidectomy surgery.  The facial nerve monitoring electrodes were placed.  The facial nerve monitoring system was functional throughout the case.  1% lidocaine with 1-100,000 epinephrine was infiltrated at the planned site of incision.  A standard left preauricular facelift incision was made in the standard fashion. The SMAS skin flap was elevated in the standard fashion.  A large 5 cm soft tissue mass was noted within the tail of the left parotid gland.  Careful dissection was performed anterior to the left auricular cartilage.  The dissection was carried down to the level of the facial nerve.  The main trunk of the facial nerve  was identified and preserved.  Dissection was then carried out to free and preserved the branches of the facial nerve.   At this time, the large parotid mass was noted to be deep to the facial nerve branches.  Dissection was then carried out to elevate the facial nerve branches from the parotid mass.  The entire parotid mass was then dissected free from the surrounding soft tissue.  It was sent to the pathology department for permanent histologic identification.  The superior and inferior branches of the facial nerve was noted to be functional.  The surgical site was copiously irrigated.  A #10 JP drain was placed.  The incision was closed in layers with 4-0 Vicryl, 5-0 Prolene, and Dermabond.  The care of the patient was turned over to the anesthesiologist.  The patient was awakened from anesthesia without difficulty.  The patient was extubated and transferred to the recovery room in good condition.  OPERATIVE FINDINGS: A 5 cm right parotid mass was noted.  The mass was deep to the facial nerve branches.  SPECIMEN: Left parotid mass.  FOLLOWUP CARE:  The patient will be observed overnight.  The patient will follow up in my office in approximately 1 week.  Joshua Morse 12/09/2019 11:20 AM

## 2019-12-09 NOTE — Transfer of Care (Signed)
Immediate Anesthesia Transfer of Care Note  Patient: Anabel Bene  Procedure(s) Performed: LEFT TOTALPAROTIDECTOMY (Left Face)  Patient Location: PACU  Anesthesia Type:General  Level of Consciousness: awake, alert  and oriented  Airway & Oxygen Therapy: Patient Spontanous Breathing and Patient connected to face mask oxygen  Post-op Assessment: Report given to RN and Post -op Vital signs reviewed and stable  Post vital signs: Reviewed and stable  Last Vitals:  Vitals Value Taken Time  BP 119/83 12/09/19 1115  Temp    Pulse 122 12/09/19 1118  Resp 20 12/09/19 1119  SpO2 93 % 12/09/19 1118  Vitals shown include unvalidated device data.  Last Pain:  Vitals:   12/09/19 0719  TempSrc: Oral  PainSc: 0-No pain         Complications: No complications documented.

## 2019-12-10 ENCOUNTER — Encounter (HOSPITAL_BASED_OUTPATIENT_CLINIC_OR_DEPARTMENT_OTHER): Payer: Self-pay | Admitting: Otolaryngology

## 2019-12-10 DIAGNOSIS — K219 Gastro-esophageal reflux disease without esophagitis: Secondary | ICD-10-CM | POA: Diagnosis not present

## 2019-12-10 DIAGNOSIS — Z9049 Acquired absence of other specified parts of digestive tract: Secondary | ICD-10-CM | POA: Diagnosis not present

## 2019-12-10 DIAGNOSIS — J339 Nasal polyp, unspecified: Secondary | ICD-10-CM | POA: Diagnosis not present

## 2019-12-10 DIAGNOSIS — Z79899 Other long term (current) drug therapy: Secondary | ICD-10-CM | POA: Diagnosis not present

## 2019-12-10 DIAGNOSIS — Z87891 Personal history of nicotine dependence: Secondary | ICD-10-CM | POA: Diagnosis not present

## 2019-12-10 DIAGNOSIS — K116 Mucocele of salivary gland: Secondary | ICD-10-CM | POA: Diagnosis not present

## 2019-12-10 NOTE — Discharge Summary (Signed)
Physician Discharge Summary  Patient ID: Joshua Morse MRN: [id number] DOB/AGE: 56-Jan-1967 56 y.o.  Admit date: 12/09/2019 Discharge date: 12/10/2019  Admission Diagnoses: Left parotid mass  Discharge Diagnoses: Left parotid mass Active Problems:   History of parotidectomy   Discharged Condition: good  Hospital Course: Pt had an uneventful overnight stay. Pt tolerated po well. No bleeding. No stridor. Facial nerve intact.  Consults: None  Significant Diagnostic Studies: None  Treatments: surgery: Left total parotidectomy  Discharge Exam: Blood pressure 129/88, pulse 70, temperature (!) 97.1 F (36.2 C), resp. rate 16, height 6' (1.829 m), weight 99.7 kg, SpO2 96 %. Incision/Wound:c/d/i Facial nerve intact bilaterally.  Disposition: Discharge disposition: 01-Home or Self Care       Discharge Instructions    Activity as tolerated - No restrictions   Complete by: As directed    Diet general   Complete by: As directed    No wound care   Complete by: As directed      Allergies as of 12/10/2019   No Known Allergies     Medication List    TAKE these medications   amoxicillin 875 MG tablet Commonly known as: AMOXIL Take 1 tablet (875 mg total) by mouth 2 (two) times daily for 3 days.   colchicine 0.6 MG tablet Take 1.2mg  initially followed by 0.6mg  1 hour later.  Continue 0.6mg  daily until resolution   indomethacin 50 MG capsule Commonly known as: INDOCIN Take 1 capsule (50 mg total) by mouth 3 (three) times daily with meals. As needed for gouty attacks.   oxyCODONE-acetaminophen 5-325 MG tablet Commonly known as: Percocet Take 1 tablet by mouth every 4 (four) hours as needed for up to 3 days for severe pain.   triamcinolone cream 0.1 % Commonly known as: KENALOG APP EXT AA BID       Follow-up Information    Leta Baptist, MD On 12/23/2019.   Specialty: Otolaryngology Why: at [employer] information: [place]  [id number] [phone]        Follow up On 12/16/2019.   Why: at 1pm              Signed: Rai Severns W Yashua Bracco 12/10/2019, 7:17 AM

## 2019-12-11 LAB — SURGICAL PATHOLOGY

## 2021-06-06 ENCOUNTER — Other Ambulatory Visit: Payer: Self-pay

## 2021-06-08 ENCOUNTER — Encounter: Payer: Self-pay | Admitting: Family Medicine

## 2021-06-08 ENCOUNTER — Ambulatory Visit (INDEPENDENT_AMBULATORY_CARE_PROVIDER_SITE_OTHER): Payer: BC Managed Care – PPO | Admitting: Family Medicine

## 2021-06-08 ENCOUNTER — Other Ambulatory Visit: Payer: Self-pay

## 2021-06-08 VITALS — BP 120/78 | HR 75 | Temp 97.4°F | Ht 72.0 in | Wt 218.6 lb

## 2021-06-08 DIAGNOSIS — Z125 Encounter for screening for malignant neoplasm of prostate: Secondary | ICD-10-CM | POA: Diagnosis not present

## 2021-06-08 DIAGNOSIS — E7800 Pure hypercholesterolemia, unspecified: Secondary | ICD-10-CM

## 2021-06-08 DIAGNOSIS — R194 Change in bowel habit: Secondary | ICD-10-CM

## 2021-06-08 DIAGNOSIS — L309 Dermatitis, unspecified: Secondary | ICD-10-CM

## 2021-06-08 DIAGNOSIS — D171 Benign lipomatous neoplasm of skin and subcutaneous tissue of trunk: Secondary | ICD-10-CM

## 2021-06-08 DIAGNOSIS — Z0000 Encounter for general adult medical examination without abnormal findings: Secondary | ICD-10-CM

## 2021-06-08 DIAGNOSIS — Z8739 Personal history of other diseases of the musculoskeletal system and connective tissue: Secondary | ICD-10-CM | POA: Diagnosis not present

## 2021-06-08 DIAGNOSIS — R739 Hyperglycemia, unspecified: Secondary | ICD-10-CM

## 2021-06-08 DIAGNOSIS — Z23 Encounter for immunization: Secondary | ICD-10-CM | POA: Insufficient documentation

## 2021-06-08 DIAGNOSIS — K649 Unspecified hemorrhoids: Secondary | ICD-10-CM

## 2021-06-08 LAB — LIPID PANEL
Cholesterol: 182 mg/dL (ref 0–200)
HDL: 31.8 mg/dL — ABNORMAL LOW (ref >39.00)
NonHDL: 150.64
Total CHOL/HDL Ratio: 6
Triglycerides: 278 mg/dL — ABNORMAL HIGH (ref 0.0–149.0)
VLDL: 55.6 mg/dL — ABNORMAL HIGH (ref 0.0–40.0)

## 2021-06-08 LAB — COMPREHENSIVE METABOLIC PANEL
ALT: 41 U/L (ref 0–53)
AST: 25 U/L (ref 0–37)
Albumin: 4.4 g/dL (ref 3.5–5.2)
Alkaline Phosphatase: 110 U/L (ref 39–117)
BUN: 13 mg/dL (ref 6–23)
CO2: 22 mEq/L (ref 19–32)
Calcium: 9.9 mg/dL (ref 8.4–10.5)
Chloride: 106 mEq/L (ref 96–112)
Creatinine, Ser: 0.84 mg/dL (ref 0.40–1.50)
GFR: 96.62 mL/min (ref >60.00)
Glucose, Bld: 124 mg/dL — ABNORMAL HIGH (ref 70–99)
Potassium: 4 mEq/L (ref 3.5–5.1)
Sodium: 137 mEq/L (ref 135–145)
Total Bilirubin: 0.3 mg/dL (ref 0.2–1.2)
Total Protein: 7.2 g/dL (ref 6.0–8.3)

## 2021-06-08 LAB — URINALYSIS, ROUTINE W REFLEX MICROSCOPIC
Bilirubin Urine: NEGATIVE
Hgb urine dipstick: NEGATIVE
Ketones, ur: NEGATIVE
Leukocytes,Ua: NEGATIVE
Nitrite: NEGATIVE
RBC / HPF: NONE SEEN (ref 0–?)
Specific Gravity, Urine: 1.01 (ref 1.000–1.030)
Total Protein, Urine: NEGATIVE
Urine Glucose: NEGATIVE
Urobilinogen, UA: 0.2 (ref 0.0–1.0)
pH: 6 (ref 5.0–8.0)

## 2021-06-08 LAB — CBC
HCT: 48.7 % (ref 39.0–52.0)
Hemoglobin: 16.2 g/dL (ref 13.0–17.0)
MCHC: 33.2 g/dL (ref 30.0–36.0)
MCV: 98 fl (ref 78.0–100.0)
Platelets: 245 10*3/uL (ref 150.0–400.0)
RBC: 4.97 Mil/uL (ref 4.22–5.81)
RDW: 13 % (ref 11.5–15.5)
WBC: 9.8 10*3/uL (ref 4.0–10.5)

## 2021-06-08 LAB — LDL CHOLESTEROL, DIRECT: Direct LDL: 107 mg/dL

## 2021-06-08 LAB — URIC ACID: Uric Acid, Serum: 6.4 mg/dL (ref 4.0–7.8)

## 2021-06-08 LAB — PSA: PSA: 0.95 ng/mL (ref 0.10–4.00)

## 2021-06-08 LAB — HEMOGLOBIN A1C: Hgb A1c MFr Bld: 6.5 % (ref 4.6–6.5)

## 2021-06-08 MED ORDER — TRIAMCINOLONE ACETONIDE 0.1 % EX CREA
TOPICAL_CREAM | CUTANEOUS | 0 refills | Status: AC
Start: 2021-06-08 — End: ?

## 2021-06-08 NOTE — Progress Notes (Signed)
Established Patient Office Visit  Subjective:  Patient ID: Joshua Morse, male    DOB: 17-Jan-1966  Age: 56 y.o. MRN: [id number]  CC:  Chief Complaint  Patient presents with   Annual Exam    CPE, concerns about digestive issues x few months. Patient fasting.     HPI Joshua Morse presents for a physical exam and discussion of various health issues.  He has noted a change in his bowel habits over the last 5 or 6 months.  He has bouts of constipation alternating with bouts of loose stool.  There is no blood or melena in his stool.  No appreciable weight loss.  Under increased stress in his job.  Recent gouty attack that responded to indomethacin and colchicine.  Sometimes deals with a dry mouth.  He would like for Shingrix vaccine today.  Has noted some decrease in the force of his urine stream.  Would like a refill of the triamcinolone for eczema on his lower legs.  Past Medical History:  Diagnosis Date   Gout    Mass of left parotid gland     Past Surgical History:  Procedure Laterality Date   PAROTIDECTOMY Left 12/09/2019   Procedure: LEFT TOTALPAROTIDECTOMY;  Surgeon: Leta Baptist, MD;  Location: [facility];  Service: ENT;  Laterality: Left;  REQUESTING  RNFA FOR START   TONSILLECTOMY      Family History  Problem Relation Age of Onset   Gout Maternal Grandmother    Gout Paternal Grandfather     Social History   Socioeconomic History   Marital status: Married    Spouse name: Not on file   Number of children: Not on file   Years of education: Not on file   Highest education level: Not on file  Occupational History   Not on file  Tobacco Use   Smoking status: Former   Smokeless tobacco: Never  Vaping Use   Vaping Use: Never used  Substance and Sexual Activity   Alcohol use: Yes    Comment: social   Drug use: Never   Sexual activity: Yes  Other Topics Concern   Not on file  Social History Narrative   Not on file   Social Determinants of Health    Financial Resource Strain: Not on file  Food Insecurity: Not on file  Transportation Needs: Not on file  Physical Activity: Not on file  Stress: Not on file  Social Connections: Not on file  Intimate Partner Violence: Not on file    Outpatient Medications Prior to Visit  Medication Sig Dispense Refill   colchicine 0.6 MG tablet Take 1.552m initially followed by 0.675m1 hour later.  Continue 0.52m58maily until resolution 30 tablet 2   indomethacin (INDOCIN) 50 MG capsule Take 1 capsule (50 mg total) by mouth 3 (three) times daily with meals. As needed for gouty attacks. 30 capsule 1   triamcinolone cream (KENALOG) 0.1 % APP EXT AA BID     No facility-administered medications prior to visit.    No Known Allergies  ROS Review of Systems  Constitutional:  Negative for chills, diaphoresis, fatigue, fever and unexpected weight change.  HENT: Negative.    Eyes:  Negative for photophobia and visual disturbance.  Respiratory: Negative.    Cardiovascular: Negative.   Gastrointestinal:  Positive for abdominal pain, constipation and diarrhea. Negative for anal bleeding, blood in stool and rectal pain.  Endocrine: Negative for polyphagia and polyuria.  Genitourinary:  Positive for difficulty urinating. Negative for  frequency, hematuria and urgency.  Musculoskeletal:  Positive for arthralgias.  Skin:  Positive for rash.  Neurological:  Negative for speech difficulty and weakness.  Depression screen [facility] 2/9 06/08/2021 06/08/2021 11/27/2019  Decreased Interest 1 0 0  Down, Depressed, Hopeless 1 0 0  PHQ - 2 Score 2 0 0  Altered sleeping 2 - -  Tired, decreased energy 1 - -  Change in appetite 0 - -  Feeling bad or failure about yourself  0 - -  Trouble concentrating 1 - -  Moving slowly or fidgety/restless 0 - -  Suicidal thoughts 0 - -  PHQ-9 Score 6 - -  Difficult doing work/chores Somewhat difficult - -       Objective:    Physical Exam Vitals and nursing note reviewed.   Constitutional:      General: He is not in acute distress.    Appearance: Normal appearance. He is not ill-appearing, toxic-appearing or diaphoretic.  HENT:     Head: Normocephalic and atraumatic.     Right Ear: Tympanic membrane, ear canal and external ear normal.     Left Ear: Tympanic membrane, ear canal and external ear normal.     Mouth/Throat:     Mouth: Mucous membranes are dry.     Pharynx: Oropharynx is clear. No oropharyngeal exudate or posterior oropharyngeal erythema.  Eyes:     General: No scleral icterus.       Right eye: No discharge.        Left eye: No discharge.     Extraocular Movements: Extraocular movements intact.     Conjunctiva/sclera: Conjunctivae normal.     Pupils: Pupils are equal, round, and reactive to light.  Neck:     Vascular: No carotid bruit.  Cardiovascular:     Rate and Rhythm: Normal rate and regular rhythm.  Pulmonary:     Effort: Pulmonary effort is normal.     Breath sounds: Normal breath sounds.  Abdominal:     General: Abdomen is flat. Bowel sounds are normal. There is no distension.     Palpations: Abdomen is soft. There is no mass.     Tenderness: There is no abdominal tenderness. There is no guarding or rebound.     Hernia: No hernia is present. There is no hernia in the left inguinal area or right inguinal area.  Genitourinary:    Penis: Circumcised. No hypospadias, erythema, tenderness, discharge, swelling or lesions.      Testes:        Right: Mass, tenderness or swelling not present. Right testis is descended.        Left: Mass, tenderness or swelling not present. Left testis is descended.     Epididymis:     Right: Not inflamed or enlarged.     Left: Not inflamed or enlarged.     Prostate: Enlarged. Not tender and no nodules present.     Rectum: Guaiac result negative. External hemorrhoid present. No mass, tenderness, anal fissure or internal hemorrhoid. Normal anal tone.  Musculoskeletal:     Cervical back: No rigidity or  tenderness.     Right lower leg: No edema.     Left lower leg: No edema.  Lymphadenopathy:     Cervical: No cervical adenopathy.     Lower Body: No right inguinal adenopathy. No left inguinal adenopathy.  Skin:    General: Skin is warm and dry.       Neurological:     General: No focal deficit present.  Mental Status: He is alert and oriented to person, place, and time.  Psychiatric:        Mood and Affect: Mood normal.        Behavior: Behavior normal.    BP 120/78 (BP Location: Right Arm, Patient Position: Sitting, Cuff Size: Normal)    Pulse 75    Temp (!) 97.4 F (36.3 C) (Temporal)    Ht 6' (1.829 m)    Wt 218 lb 9.6 oz (99.2 kg)    SpO2 97%    BMI 29.65 kg/m  Wt Readings from Last 3 Encounters:  06/08/21 218 lb 9.6 oz (99.2 kg)  12/09/19 219 lb 12.8 oz (99.7 kg)  11/27/19 220 lb 12.8 oz (100.2 kg)     Health Maintenance Due  Topic Date Due   HIV Screening  Never done   Hepatitis C Screening  Never done   Fecal DNA (Cologuard)  04/29/2021    There are no preventive care reminders to display for this patient.  Lab Results  Component Value Date   TSH 1.68 04/16/2018   Lab Results  Component Value Date   WBC 10.8 (H) 11/05/2019   HGB 15.9 11/05/2019   HCT 46.7 11/05/2019   MCV 99.4 11/05/2019   PLT 228.0 11/05/2019   Lab Results  Component Value Date   NA 139 11/05/2019   K 3.8 11/05/2019   CO2 25 11/05/2019   GLUCOSE 118 (H) 11/05/2019   BUN 11 11/05/2019   CREATININE 1.00 11/05/2019   BILITOT 0.5 04/16/2018   ALKPHOS 113 04/16/2018   AST 23 11/05/2019   ALT 32 11/05/2019   PROT 6.8 04/16/2018   ALBUMIN 4.3 04/16/2018   CALCIUM 9.4 11/05/2019   GFR 77.25 11/05/2019   Lab Results  Component Value Date   CHOL 183 11/05/2019   Lab Results  Component Value Date   HDL 41.40 11/05/2019   Lab Results  Component Value Date   LDLCALC 149 (H) 04/16/2018   Lab Results  Component Value Date   TRIG 220.0 (H) 11/05/2019   Lab Results   Component Value Date   CHOLHDL 4 11/05/2019   Lab Results  Component Value Date   HGBA1C 6.1 11/05/2019      Assessment & Plan:   Problem List Items Addressed This Visit       Cardiovascular and Mediastinum   Hemorrhoids   Relevant Orders   Ambulatory referral to General Surgery     Musculoskeletal and Integument   Eczema   Relevant Medications   triamcinolone cream (KENALOG) 0.1 %     Other   Healthcare maintenance - Primary   Relevant Orders   Comp Met (CMET)   Urinalysis, Routine w reflex microscopic   CBC   PSA   Elevated cholesterol   Relevant Orders   Comp Met (CMET)   Lipid Profile   Elevated blood sugar   Relevant Orders   Hemoglobin A1c   History of gout   Relevant Orders   Comp Met (CMET)   Uric acid   Lipoma of torso   Relevant Orders   Ambulatory referral to General Surgery   Need for shingles vaccine   Relevant Orders   Varicella-zoster vaccine IM (Shingrix) (Completed)   Change in bowel habits   Relevant Orders   Ambulatory referral to Gastroenterology    Meds ordered this encounter  Medications   triamcinolone cream (KENALOG) 0.1 %    Sig: Applied to rash on legs twice daily as needed.  Dispense:  80 g    Refill:  0    Follow-up: Return in about 6 months (around 12/09/2021), or if symptoms worsen or fail to improve.   Information given on health maintenance and disease prevention as well as eczema hemorrhoids lipoma.  Would like to see a general surgeon for discussion of lipoma extraction and consultation for his hemorrhoids.  Consultation with GI for change in bowel habits and need for colonoscopy.  Suggested Biotin mouthwash for dry mouth.  Invited him back to discuss his sadness and trouble sleeping with the stress involved his new job Libby Maw, MD

## 2021-06-09 NOTE — Progress Notes (Signed)
Please schedule a virtual or ftf to discuss lab results.

## 2021-06-15 ENCOUNTER — Encounter: Payer: Self-pay | Admitting: Family Medicine

## 2021-06-15 ENCOUNTER — Telehealth: Payer: BC Managed Care – PPO | Admitting: Family Medicine

## 2021-06-15 VITALS — Ht 72.0 in

## 2021-06-15 DIAGNOSIS — R7303 Prediabetes: Secondary | ICD-10-CM

## 2021-06-15 MED ORDER — METFORMIN HCL ER 500 MG PO TB24: 500.0000 mg | ORAL_TABLET | Freq: Every day | ORAL | 1 refills | Status: DC

## 2021-06-15 NOTE — Progress Notes (Signed)
? ?Established Patient Office Visit ? ?Subjective:  ?Patient ID: Joshua Morse, male    DOB: Jan 17, 1966  Age: 56 y.o. MRN: [id number] ? ?CC:  ?Chief Complaint  ?Patient presents with  ? Advice Only  ?  Discuss labs   ? ? ?HPI ?Joshua Morse presents for a discussion about his blood work results.  By enlarge labs were okay.  Hemoglobin A1c was measured at 6.5.  Positive family history of diabetes in his father and grandfather.  His hemoglobin A1c has been elevated in the past and responded to exercise and weight loss. ? ?Past Medical History:  ?Diagnosis Date  ? Gout   ? Mass of left parotid gland   ? ? ?Past Surgical History:  ?Procedure Laterality Date  ? PAROTIDECTOMY Left 12/09/2019  ? Procedure: LEFT TOTALPAROTIDECTOMY;  Surgeon: Leta Baptist, MD;  Location: [facility];  Service: ENT;  Laterality: Left;  REQUESTING  RNFA FOR START  ? TONSILLECTOMY    ? ? ?Family History  ?Problem Relation Age of Onset  ? Gout Maternal Grandmother   ? Gout Paternal Grandfather   ? ? ?Social History  ? ?Socioeconomic History  ? Marital status: Married  ?  Spouse name: Not on file  ? Number of children: Not on file  ? Years of education: Not on file  ? Highest education level: Not on file  ?Occupational History  ? Not on file  ?Tobacco Use  ? Smoking status: Former  ? Smokeless tobacco: Never  ?Vaping Use  ? Vaping Use: Never used  ?Substance and Sexual Activity  ? Alcohol use: Yes  ?  Comment: social  ? Drug use: Never  ? Sexual activity: Yes  ?Other Topics Concern  ? Not on file  ?Social History Narrative  ? Not on file  ? ?Social Determinants of Health  ? ?Financial Resource Strain: Not on file  ?Food Insecurity: Not on file  ?Transportation Needs: Not on file  ?Physical Activity: Not on file  ?Stress: Not on file  ?Social Connections: Not on file  ?Intimate Partner Violence: Not on file  ? ? ?Outpatient Medications Prior to Visit  ?Medication Sig Dispense Refill  ? colchicine 0.6 MG tablet Take 1.'2mg'$  initially followed  by 0.'6mg'$  1 hour later.  Continue 0.'6mg'$  daily until resolution 30 tablet 2  ? indomethacin (INDOCIN) 50 MG capsule Take 1 capsule (50 mg total) by mouth 3 (three) times daily with meals. As needed for gouty attacks. 30 capsule 1  ? triamcinolone cream (KENALOG) 0.1 % Applied to rash on legs twice daily as needed. 80 g 0  ? ?No facility-administered medications prior to visit.  ? ? ?No Known Allergies ? ?ROS ?Review of Systems  ?Constitutional: Negative.   ?Respiratory: Negative.    ?Cardiovascular: Negative.   ?Gastrointestinal: Negative.   ?Endocrine: Negative for polyphagia and polyuria.  ? ?  ?Objective:  ?  ?Physical Exam ?Vitals and nursing note reviewed.  ?Constitutional:   ?   General: He is not in acute distress. ?   Appearance: Normal appearance. He is not ill-appearing, toxic-appearing or diaphoretic.  ?HENT:  ?   Head: Normocephalic and atraumatic.  ?Eyes:  ?   General:     ?   Right eye: No discharge.     ?   Left eye: No discharge.  ?   Extraocular Movements: Extraocular movements intact.  ?   Conjunctiva/sclera: Conjunctivae normal.  ?Pulmonary:  ?   Effort: Pulmonary effort is normal.  ?Neurological:  ?  Mental Status: He is alert and oriented to person, place, and time.  ?Psychiatric:     ?   Mood and Affect: Mood normal.     ?   Behavior: Behavior normal.  ? ? ?Ht 6' (1.829 m)   BMI 29.65 kg/m?  ?Wt Readings from Last 3 Encounters:  ?06/08/21 218 lb 9.6 oz (99.2 kg)  ?12/09/19 219 lb 12.8 oz (99.7 kg)  ?11/27/19 220 lb 12.8 oz (100.2 kg)  ? ? ? ?Health Maintenance Due  ?Topic Date Due  ? HIV Screening  Never done  ? Hepatitis C Screening  Never done  ? Fecal DNA (Cologuard)  04/29/2021  ? ? ?There are no preventive care reminders to display for this patient. ? ?Lab Results  ?Component Value Date  ? TSH 1.68 04/16/2018  ? ?Lab Results  ?Component Value Date  ? WBC 9.8 06/08/2021  ? HGB 16.2 06/08/2021  ? HCT 48.7 06/08/2021  ? MCV 98.0 06/08/2021  ? PLT 245.0 06/08/2021  ? ?Lab Results  ?Component  Value Date  ? NA 137 06/08/2021  ? K 4.0 06/08/2021  ? CO2 22 06/08/2021  ? GLUCOSE 124 (H) 06/08/2021  ? BUN 13 06/08/2021  ? CREATININE 0.84 06/08/2021  ? BILITOT 0.3 06/08/2021  ? ALKPHOS 110 06/08/2021  ? AST 25 06/08/2021  ? ALT 41 06/08/2021  ? PROT 7.2 06/08/2021  ? ALBUMIN 4.4 06/08/2021  ? CALCIUM 9.9 06/08/2021  ? GFR 96.62 06/08/2021  ? ?Lab Results  ?Component Value Date  ? CHOL 182 06/08/2021  ? ?Lab Results  ?Component Value Date  ? HDL 31.80 (L) 06/08/2021  ? ?Lab Results  ?Component Value Date  ? LDLCALC 149 (H) 04/16/2018  ? ?Lab Results  ?Component Value Date  ? TRIG 278.0 (H) 06/08/2021  ? ?Lab Results  ?Component Value Date  ? CHOLHDL 6 06/08/2021  ? ?Lab Results  ?Component Value Date  ? HGBA1C 6.5 06/08/2021  ? ? ?  ?Assessment & Plan:  ? ?Problem List Items Addressed This Visit   ?None ?Visit Diagnoses   ? ? Pre-diabetes    -  Primary  ? Relevant Medications  ? metFORMIN (GLUCOPHAGE-XR) 500 MG 24 hr tablet  ? ?  ? ? ?Meds ordered this encounter  ?Medications  ? metFORMIN (GLUCOPHAGE-XR) 500 MG 24 hr tablet  ?  Sig: Take 1 tablet (500 mg total) by mouth daily with breakfast.  ?  Dispense:  90 tablet  ?  Refill:  1  ? ? ?Follow-up: Return in about 6 months (around 12/16/2021), or if symptoms worsen or fail to improve.  ?Recommended using metformin to help prevent the onset and progression of diabetes.  Patient agrees to try.  Warned about stomach upset and loose stool.  Advised that if the symptoms occur they often pass in a few [place].  If they do not he will let me know.  He will exercise and try to lose some weight. ? ?Libby Maw, MD ? ?Virtual Visit via Video Note ? ?I connected with Joshua Morse on 06/15/21 at  8:40 AM EST by a video enabled telemedicine application and verified that I am speaking with the correct person using two identifiers. ? 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  ?Provider: work ?  ?I discussed the limitations of evaluation and management by  telemedicine and the availability of in person appointments. The patient expressed understanding and agreed to proceed. ? ?History of Present Illness: ? ?  ?Observations/Objective: ? ? ?Assessment and  Plan: ? ? ?Follow Up Instructions: ? ?  ?I discussed the assessment and treatment plan with the patient. The patient was provided an opportunity to ask questions and all were answered. The patient agreed with the plan and demonstrated an understanding of the instructions. ?  ?The patient was advised to call back or seek an in-person evaluation if the symptoms worsen or if the condition fails to improve as anticipated. ? ?I provided 25 minutes of non-face-to-face time during this encounter. ? ? ?Libby Maw, MD  ?

## 2021-11-25 ENCOUNTER — Telehealth: Payer: Self-pay | Admitting: Family Medicine

## 2021-11-25 DIAGNOSIS — Z8739 Personal history of other diseases of the musculoskeletal system and connective tissue: Secondary | ICD-10-CM

## 2021-11-25 IMAGING — US US SOFT TISSUE HEAD/NECK
1 series · 11 of 11 positions shown · non-contrast
Comparison: No pertinent prior exams are available for comparison.

CLINICAL DATA: Left facial swelling, ? Subcutaneous mass.
Additional history provided by scanning technologist: Left facial
swelling for 1 month, no known injury, no pain.

EXAM:
ULTRASOUND OF HEAD/NECK SOFT TISSUES
TECHNIQUE: Ultrasound examination of the head and neck soft tissues was
performed in the area of clinical concern.

[Series 1: us soft tissue head/neck · 0.07mm/px · 11 acquisitions, 11 frames shown]
[im 1/11]
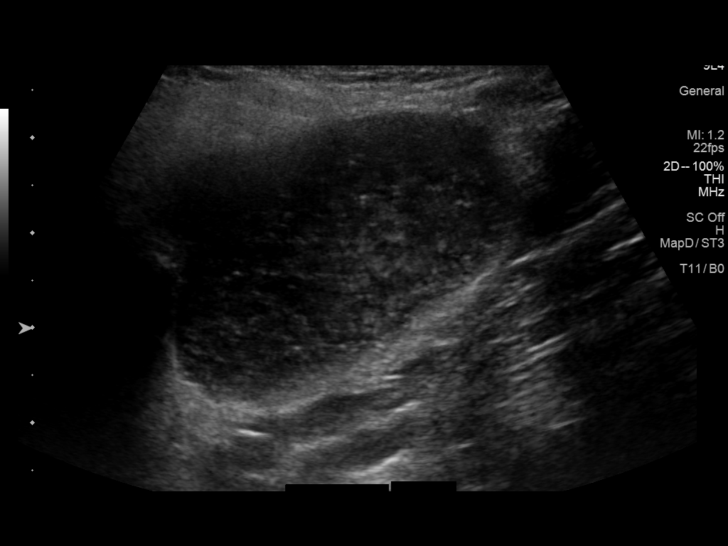
[im 2/11]
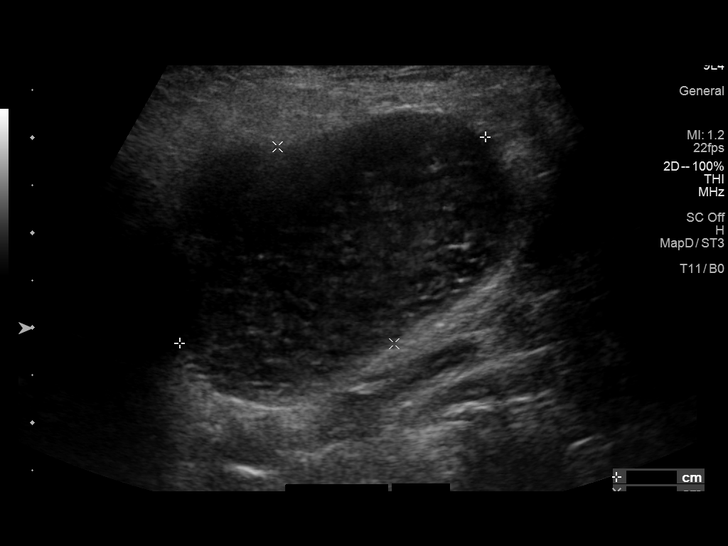
[im 3/11]
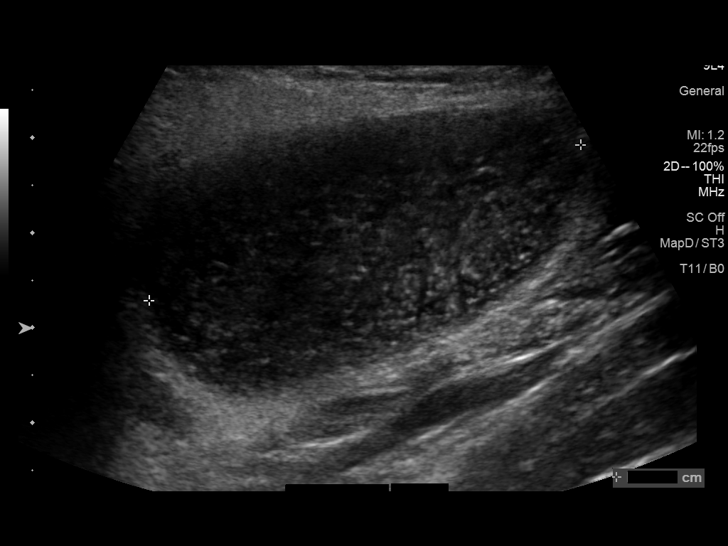
[im 4/11]
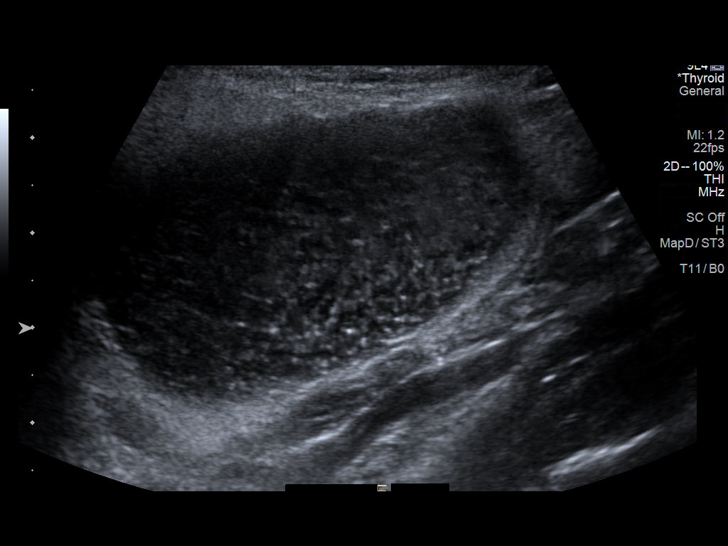
[im 5/11]
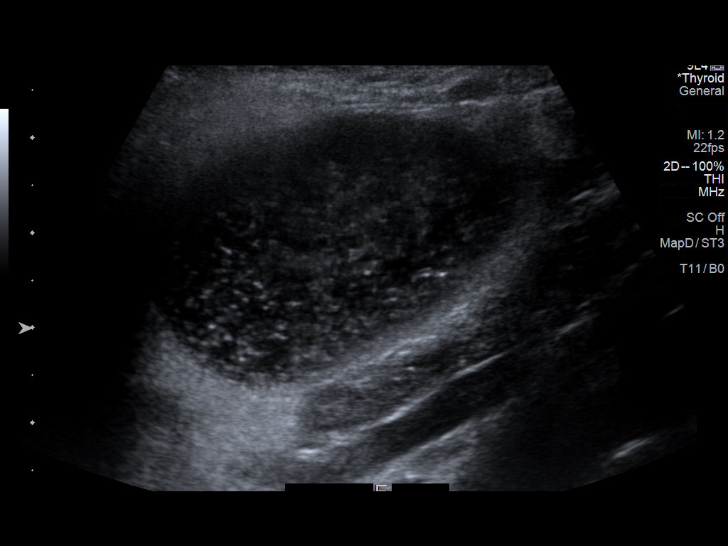
[im 6/11]
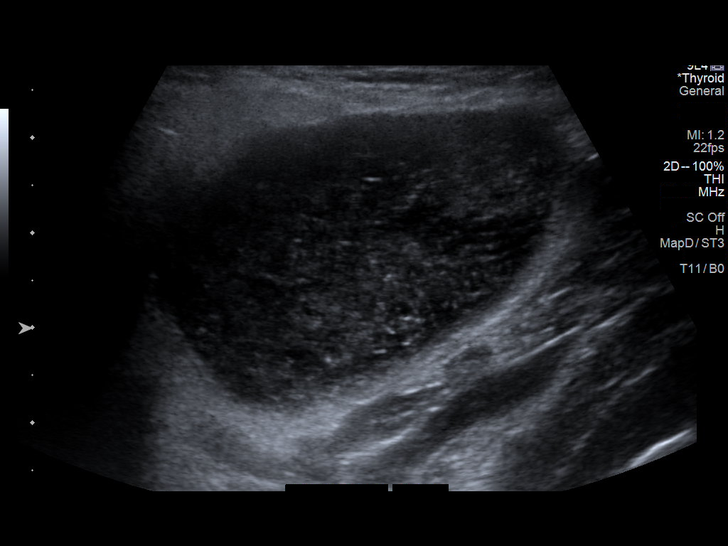
[im 7/11]
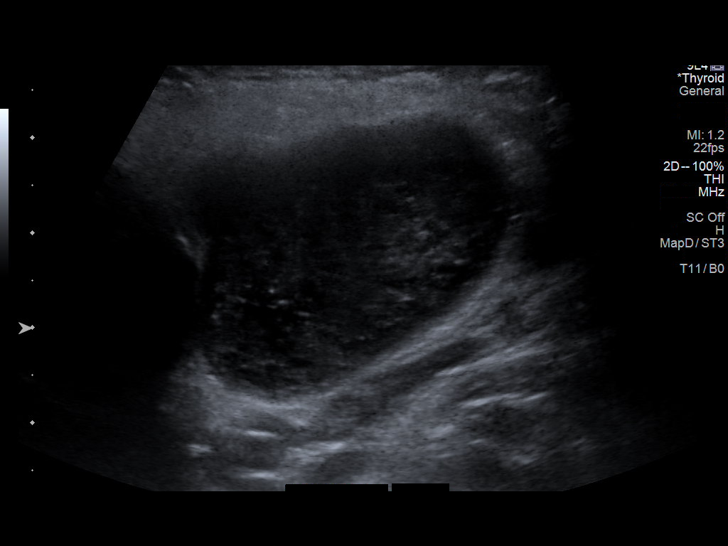
[im 8/11]
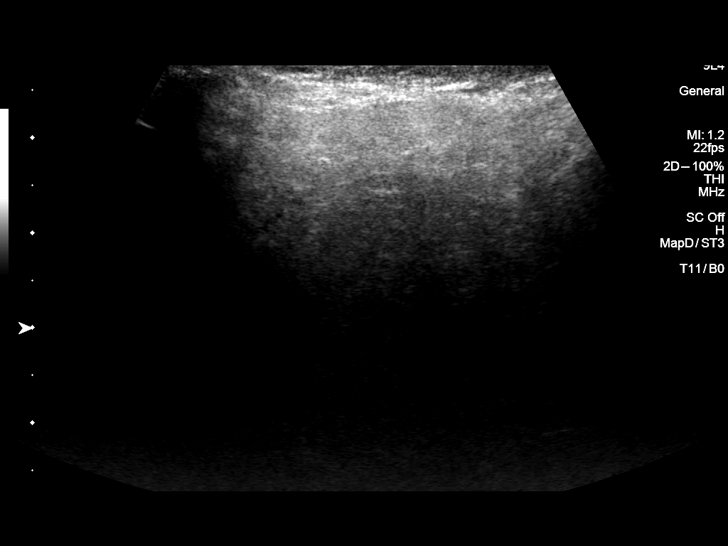
[im 9/11]
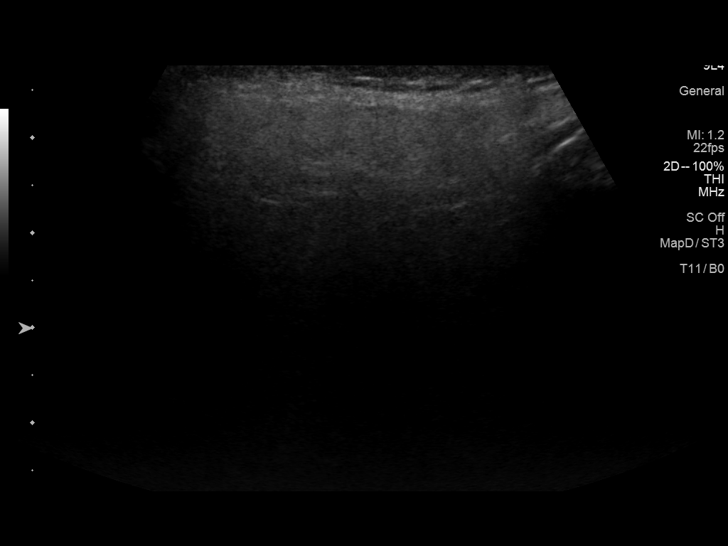
[im 10/11]
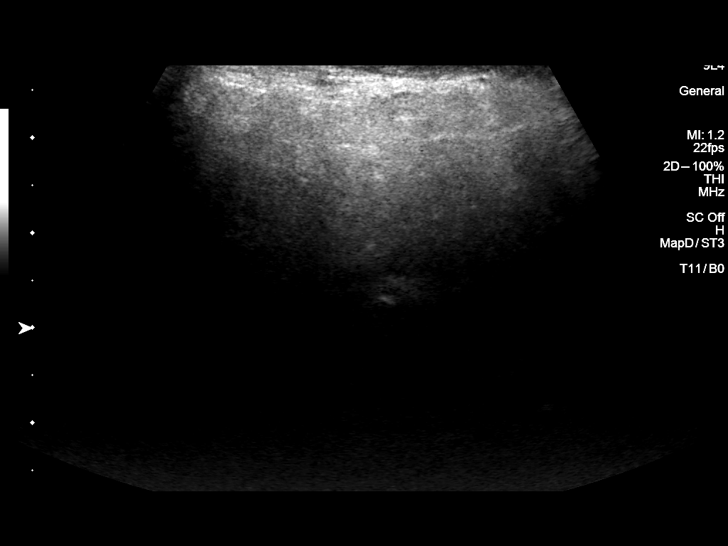
[im 11/11]
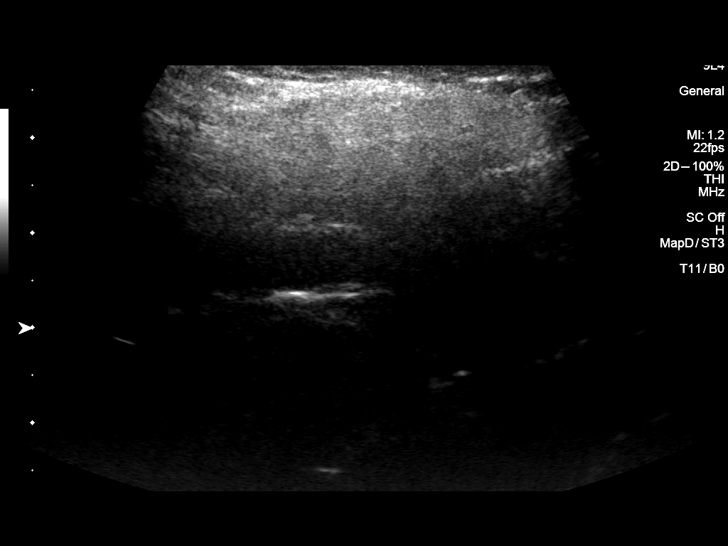

[11 of 11 positions shown; findings below may reference images not displayed]

FINDINGS: Within the left parotid region of concern, there is a circumscribed
ovoid cystic lesion measuring 3.9 x 2.4 x 4.8 cm containing mobile
echogenic debris.

These results will be called to the ordering clinician or
representative by the Radiologist Assistant, and communication
documented in the PACS or [REDACTED].
IMPRESSION: 3.9 x 2.4 x 4.8 cm ovoid cystic lesion containing mobile internal
debris within the left parotid region of concern. These findings are
nonspecific and differential considerations include cystic neoplasm,
benign cystic lesion (i.e. sialocele, postinflammatory pseudocyst,
first branchial cleft cyst), necrotic lymph node, among others.
Additionally, abscess is difficult to exclude and clinical
correlation is recommended. Contrast-enhanced neck CT is also
recommended for further characterization.

## 2021-11-25 MED ORDER — COLCHICINE 0.6 MG PO TABS: ORAL_TABLET | ORAL | 2 refills | Status: AC

## 2021-11-25 MED ORDER — INDOMETHACIN 50 MG PO CAPS: 50.0000 mg | ORAL_CAPSULE | Freq: Three times a day (TID) | ORAL | 1 refills | Status: DC

## 2021-11-25 NOTE — Telephone Encounter (Signed)
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 [place] [address]  Phone:  [phone]  Fax:  [phone]

## 2021-12-13 ENCOUNTER — Other Ambulatory Visit: Payer: Self-pay | Admitting: Family Medicine

## 2021-12-13 DIAGNOSIS — Z8739 Personal history of other diseases of the musculoskeletal system and connective tissue: Secondary | ICD-10-CM

## 2021-12-21 ENCOUNTER — Other Ambulatory Visit: Payer: Self-pay | Admitting: Family Medicine

## 2021-12-21 DIAGNOSIS — Z8739 Personal history of other diseases of the musculoskeletal system and connective tissue: Secondary | ICD-10-CM

## 2021-12-24 ENCOUNTER — Other Ambulatory Visit: Payer: Self-pay | Admitting: Family Medicine

## 2021-12-24 DIAGNOSIS — R7303 Prediabetes: Secondary | ICD-10-CM

## 2022-03-23 ENCOUNTER — Other Ambulatory Visit: Payer: Self-pay | Admitting: Family Medicine

## 2022-03-23 DIAGNOSIS — R7303 Prediabetes: Secondary | ICD-10-CM

## 2022-06-20 ENCOUNTER — Other Ambulatory Visit: Payer: Self-pay | Admitting: Family Medicine

## 2022-06-20 DIAGNOSIS — R7303 Prediabetes: Secondary | ICD-10-CM

## 2022-06-20 NOTE — Telephone Encounter (Signed)
LVM for pt to call the office to scheduled follow-up appointment.

## 2022-08-11 ENCOUNTER — Encounter: Payer: Self-pay | Admitting: Family Medicine

## 2022-08-11 ENCOUNTER — Ambulatory Visit (INDEPENDENT_AMBULATORY_CARE_PROVIDER_SITE_OTHER): Payer: BC Managed Care – PPO | Admitting: Family Medicine

## 2022-08-11 VITALS — BP 124/82 | HR 77 | Temp 97.9°F | Ht 72.0 in | Wt 216.6 lb

## 2022-08-11 DIAGNOSIS — Z1211 Encounter for screening for malignant neoplasm of colon: Secondary | ICD-10-CM

## 2022-08-11 DIAGNOSIS — Z0000 Encounter for general adult medical examination without abnormal findings: Secondary | ICD-10-CM

## 2022-08-11 DIAGNOSIS — Z8739 Personal history of other diseases of the musculoskeletal system and connective tissue: Secondary | ICD-10-CM | POA: Diagnosis not present

## 2022-08-11 DIAGNOSIS — Z23 Encounter for immunization: Secondary | ICD-10-CM | POA: Diagnosis not present

## 2022-08-11 DIAGNOSIS — R7303 Prediabetes: Secondary | ICD-10-CM | POA: Diagnosis not present

## 2022-08-11 NOTE — Progress Notes (Signed)
Established Patient Office Visit   Subjective:  Patient ID: Joshua Morse, male    DOB: 14-May-1965  Age: 57 y.o. MRN: [id number]  Chief Complaint  Patient presents with   Annual Exam    CPE, no concerns. Patient not fasting.     HPI Encounter Diagnoses  Name Primary?   Healthcare maintenance Yes   Immunization due    Pre-diabetes    History of gout    Screening for colon cancer    For health check and follow-up of above.  Doing well with the metformin.  He is warted off a couple of gouty attacks with the colchicine and Indocin.  Work can be stressful.  He exercises by walking 3-4 times weekly.  He does have access to dental and regular dental care.  Things are well at home.   Review of Systems  Constitutional: Negative.   HENT: Negative.    Eyes:  Negative for blurred vision, discharge and redness.  Respiratory: Negative.    Cardiovascular: Negative.   Gastrointestinal:  Negative for abdominal pain.  Genitourinary: Negative.   Musculoskeletal: Negative.  Negative for myalgias.  Skin:  Negative for rash.  Neurological:  Negative for tingling, loss of consciousness and weakness.  Endo/Heme/Allergies:  Negative for polydipsia.     Current Outpatient Medications:    colchicine 0.6 MG tablet, Take 1.2mg  initially followed by 0.6mg  1 hour later.  Continue 0.6mg  daily until resolution, Disp: 30 tablet, Rfl: 2   indomethacin (INDOCIN) 50 MG capsule, TAKE 1 CAPSULE BY MOUTH THREE TIMES DAILY WITH MEALS AS NEEDED FOR GOUTY ATTACKS, Disp: 30 capsule, Rfl: 0   metFORMIN (GLUCOPHAGE-XR) 500 MG 24 hr tablet, TAKE 1 TABLET(500 MG) BY MOUTH DAILY WITH BREAKFAST, Disp: 30 tablet, Rfl: 1   triamcinolone cream (KENALOG) 0.1 %, Applied to rash on legs twice daily as needed., Disp: 80 g, Rfl: 0   Objective:     BP 124/82 (BP Location: Left Arm, Patient Position: Sitting, Cuff Size: Normal)   Pulse 77   Temp 97.9 F (36.6 C) (Temporal)   Ht 6' (1.829 m)   Wt 216 lb 9.6 oz (98.2 kg)    SpO2 98%   BMI 29.38 kg/m  Wt Readings from Last 3 Encounters:  08/11/22 216 lb 9.6 oz (98.2 kg)  06/08/21 218 lb 9.6 oz (99.2 kg)  12/09/19 219 lb 12.8 oz (99.7 kg)      Physical Exam Constitutional:      General: He is not in acute distress.    Appearance: Normal appearance. He is not ill-appearing, toxic-appearing or diaphoretic.  HENT:     Head: Normocephalic and atraumatic.     Right Ear: External ear normal.     Left Ear: External ear normal.     Mouth/Throat:     Mouth: Mucous membranes are moist.     Pharynx: Oropharynx is clear. No oropharyngeal exudate or posterior oropharyngeal erythema.  Eyes:     General: No scleral icterus.       Right eye: No discharge.        Left eye: No discharge.     Extraocular Movements: Extraocular movements intact.     Conjunctiva/sclera: Conjunctivae normal.     Pupils: Pupils are equal, round, and reactive to light.  Cardiovascular:     Rate and Rhythm: Normal rate and regular rhythm.  Pulmonary:     Effort: Pulmonary effort is normal. No respiratory distress.     Breath sounds: Normal breath sounds.  Abdominal:  General: Bowel sounds are normal.     Tenderness: There is no abdominal tenderness. There is no guarding.     Hernia: No hernia is present.  Musculoskeletal:     Cervical back: No rigidity or tenderness.  Skin:    General: Skin is warm and dry.  Neurological:     Mental Status: He is alert and oriented to person, place, and time.  Psychiatric:        Mood and Affect: Mood normal.        Behavior: Behavior normal.      No results found for any visits on 08/11/22.    The 10-year ASCVD risk score (Arnett DK, et al., 2019) is: 8.9%    Assessment & Plan:   Healthcare maintenance -     CBC; Future -     Comprehensive metabolic panel; Future -     Lipid panel; Future -     PSA; Future  Immunization due -     Varicella-zoster vaccine IM  Pre-diabetes -     Hemoglobin A1c; Future -     Urinalysis,  Routine w reflex microscopic; Future -     Microalbumin / creatinine urine ratio; Future  History of gout -     Uric acid; Future  Screening for colon cancer -     Ambulatory referral to Gastroenterology    Return in about 6 months (around 02/11/2023), or With further gouty attacks, please return to the office to discuss treatment for prevention..  Information was given on health maintenance and disease prevention.  Encouraged exercise for 30 minutes 5 days weekly.  Will adjust metformin for prediabetes pending results of hemoglobin A1c.  Information was given on gout, low purine eating plan and allopurinol.  May need to follow-up to discuss treatment for gout.  Mliss Sax, MD

## 2022-08-16 ENCOUNTER — Other Ambulatory Visit (INDEPENDENT_AMBULATORY_CARE_PROVIDER_SITE_OTHER): Payer: BC Managed Care – PPO

## 2022-08-16 DIAGNOSIS — R7303 Prediabetes: Secondary | ICD-10-CM

## 2022-08-16 DIAGNOSIS — Z8739 Personal history of other diseases of the musculoskeletal system and connective tissue: Secondary | ICD-10-CM

## 2022-08-16 DIAGNOSIS — Z0000 Encounter for general adult medical examination without abnormal findings: Secondary | ICD-10-CM | POA: Diagnosis not present

## 2022-08-16 LAB — LIPID PANEL
Cholesterol: 167 mg/dL (ref 0–200)
HDL: 35 mg/dL — ABNORMAL LOW (ref >39.00)
LDL Cholesterol: 92 mg/dL (ref 0–99)
NonHDL: 131.96
Total CHOL/HDL Ratio: 5
Triglycerides: 200 mg/dL — ABNORMAL HIGH (ref 0.0–149.0)
VLDL: 40 mg/dL (ref 0.0–40.0)

## 2022-08-16 LAB — URINALYSIS, ROUTINE W REFLEX MICROSCOPIC
Bilirubin Urine: NEGATIVE
Hgb urine dipstick: NEGATIVE
Ketones, ur: NEGATIVE
Leukocytes,Ua: NEGATIVE
Nitrite: NEGATIVE
RBC / HPF: NONE SEEN (ref 0–?)
Specific Gravity, Urine: 1.01 (ref 1.000–1.030)
Total Protein, Urine: NEGATIVE
Urine Glucose: NEGATIVE
Urobilinogen, UA: 0.2 (ref 0.0–1.0)
pH: 6 (ref 5.0–8.0)

## 2022-08-16 LAB — MICROALBUMIN / CREATININE URINE RATIO
Creatinine,U: 70.5 mg/dL
Microalb Creat Ratio: 1 mg/g (ref 0.0–30.0)
Microalb, Ur: <0.7 mg/dL (ref 0.0–1.9)

## 2022-08-16 LAB — CBC
HCT: 47.4 % (ref 39.0–52.0)
Hemoglobin: 15.9 g/dL (ref 13.0–17.0)
MCHC: 33.6 g/dL (ref 30.0–36.0)
MCV: 98.5 fl (ref 78.0–100.0)
Platelets: 290 10*3/uL (ref 150.0–400.0)
RBC: 4.81 Mil/uL (ref 4.22–5.81)
RDW: 13.2 % (ref 11.5–15.5)
WBC: 8 10*3/uL (ref 4.0–10.5)

## 2022-08-16 LAB — HEMOGLOBIN A1C: Hgb A1c MFr Bld: 6.7 % — ABNORMAL HIGH (ref 4.6–6.5)

## 2022-08-16 LAB — COMPREHENSIVE METABOLIC PANEL
ALT: 40 U/L (ref 0–53)
AST: 22 U/L (ref 0–37)
Albumin: 4.4 g/dL (ref 3.5–5.2)
Alkaline Phosphatase: 94 U/L (ref 39–117)
BUN: 13 mg/dL (ref 6–23)
CO2: 25 mEq/L (ref 19–32)
Calcium: 9.8 mg/dL (ref 8.4–10.5)
Chloride: 105 mEq/L (ref 96–112)
Creatinine, Ser: 0.84 mg/dL (ref 0.40–1.50)
GFR: 95.82 mL/min (ref >60.00)
Glucose, Bld: 127 mg/dL — ABNORMAL HIGH (ref 70–99)
Potassium: 4.2 mEq/L (ref 3.5–5.1)
Sodium: 141 mEq/L (ref 135–145)
Total Bilirubin: 0.4 mg/dL (ref 0.2–1.2)
Total Protein: 7.3 g/dL (ref 6.0–8.3)

## 2022-08-16 LAB — URIC ACID: Uric Acid, Serum: 7.1 mg/dL (ref 4.0–7.8)

## 2022-08-16 LAB — PSA: PSA: 1.12 ng/mL (ref 0.10–4.00)

## 2022-08-16 NOTE — Progress Notes (Signed)
2 lavender, 1 gold  LD

## 2022-08-17 MED ORDER — METFORMIN HCL ER 500 MG PO TB24: 500.0000 mg | ORAL_TABLET | Freq: Two times a day (BID) | ORAL | 1 refills | Status: DC

## 2022-08-17 NOTE — Addendum Note (Signed)
Addended by: Andrez Grime on: 08/17/2022 12:23 PM   Modules accepted: Orders

## 2022-08-28 ENCOUNTER — Encounter: Payer: Self-pay | Admitting: Internal Medicine

## 2022-09-19 ENCOUNTER — Ambulatory Visit (AMBULATORY_SURGERY_CENTER): Payer: BC Managed Care – PPO | Admitting: *Deleted

## 2022-09-19 VITALS — Ht 72.0 in | Wt 212.0 lb

## 2022-09-19 DIAGNOSIS — Z1211 Encounter for screening for malignant neoplasm of colon: Secondary | ICD-10-CM

## 2022-09-19 MED ORDER — NA SULFATE-K SULFATE-MG SULF 17.5-3.13-1.6 GM/177ML PO SOLN
1.0000 | Freq: Once | ORAL | 0 refills | Status: AC
Start: 2022-09-19 — End: 2022-09-19

## 2022-09-19 NOTE — Progress Notes (Signed)
Pt's name and DOB verified at the beginning of the pre-visit.  Pt denies any difficulty with ambulating,sitting, laying down or rolling side to side Gave both [facility] main # and MD on call # prior to instructions.  No egg or soy allergy known to patient  No issues known to pt with past sedation with any surgeries or procedures Pt denies having issues being intubated Pt has no issues moving head neck or swallowing No FH of Malignant Hyperthermia Pt is not on diet pills Pt is not on home 02  Pt is not on blood thinners  Pt denies issues with constipation  Pt is not on dialysis Pt denise any abnormal heart rhythms  Pt denies any upcoming cardiac testing Pt encouraged to use to use Singlecare or Goodrx to reduce cost  Patient's chart reviewed by Cathlyn Parsons CNRA prior to pre-visit and patient appropriate for the [facility].  Pre-visit completed and red dot placed by patient's name on their procedure day (on provider's schedule).  . Visit in person Pt scale weight is 212lb Instructed pt why it is important to and  to call if they have any changes in health or new medications. Directed them to the # given and on instructions.   Pt states they will.  Instructions reviewed with pt and pt states understanding. Instructed to review again prior to procedure. Pt states they will.  Instructions segiven to pet with coupone and sent to my chart

## 2022-09-22 ENCOUNTER — Telehealth: Payer: Self-pay | Admitting: Internal Medicine

## 2022-09-22 ENCOUNTER — Encounter: Payer: Self-pay | Admitting: Internal Medicine

## 2022-09-22 NOTE — Telephone Encounter (Signed)
Attempted to reach patient at phone number listed in EPIC; no answer; left message for patient to call back to the office for a response on his question; The patient should not have any issues with proceeding with his dental procedure and then having the colonoscopy after- he will still need to abide by the prep instructions with regards to not eating certain foods and being on clear liquids the day before his colon; Will attempt to reach patient at a later time;

## 2022-09-22 NOTE — Telephone Encounter (Signed)
Inbound call from patient stating that he is having a tooth extraction on June 25th and wants to make sure there will not be a conflict with having his colonoscopy on July 1st. Please advise.

## 2022-09-22 NOTE — Telephone Encounter (Signed)
Patient returned call to the office- patient was asking if his dental surgery needed to be rescheduled as it is close to his colonoscopy; patient was advised that as long as he is not having any oral bleeding the day prior to the procedure or the AM of the procedure he should follow through with the colonoscopy, however, he was also advised to call his oral surgeon and ask if the MD would be willing to clear him for the colonoscopy; Patient reported he would follow up with his oral surgeon but as of now he will proceed with both procedures; Patient advised to call back to the office at [phone] should questions/concerns arise;

## 2022-10-09 ENCOUNTER — Encounter: Payer: Self-pay | Admitting: Internal Medicine

## 2022-10-09 ENCOUNTER — Ambulatory Visit (AMBULATORY_SURGERY_CENTER): Payer: BC Managed Care – PPO | Admitting: Internal Medicine

## 2022-10-09 ENCOUNTER — Other Ambulatory Visit: Payer: Self-pay

## 2022-10-09 VITALS — BP 104/67 | HR 77 | Temp 97.8°F | Resp 10 | Ht 72.0 in | Wt 212.0 lb

## 2022-10-09 DIAGNOSIS — K5000 Crohn's disease of small intestine without complications: Secondary | ICD-10-CM | POA: Diagnosis not present

## 2022-10-09 DIAGNOSIS — D123 Benign neoplasm of transverse colon: Secondary | ICD-10-CM

## 2022-10-09 DIAGNOSIS — K529 Noninfective gastroenteritis and colitis, unspecified: Secondary | ICD-10-CM | POA: Diagnosis not present

## 2022-10-09 DIAGNOSIS — K635 Polyp of colon: Secondary | ICD-10-CM | POA: Diagnosis not present

## 2022-10-09 DIAGNOSIS — K633 Ulcer of intestine: Secondary | ICD-10-CM | POA: Diagnosis not present

## 2022-10-09 DIAGNOSIS — Z1211 Encounter for screening for malignant neoplasm of colon: Secondary | ICD-10-CM | POA: Diagnosis not present

## 2022-10-09 DIAGNOSIS — R194 Change in bowel habit: Secondary | ICD-10-CM

## 2022-10-09 MED ORDER — SODIUM CHLORIDE 0.9 % IV SOLN
500.0000 mL | INTRAVENOUS | Status: DC
Start: 2022-10-09 — End: 2022-10-09

## 2022-10-09 NOTE — Op Note (Signed)
[facility] Patient Name: Joshua Morse Procedure Date: 10/09/2022 9:03 AM MRN: [id number] Endoscopist: Madelyn Brunner [place] , , [id number] Age: 57 Referring MD:  Date of Birth: 1965-07-09 Gender: Male Account #: [id number] Procedure:                Colonoscopy Indications:              Screening for colorectal malignant neoplasm, This                            is the patient's first colonoscopy Medicines:                Monitored Anesthesia Care Procedure:                Pre-Anesthesia Assessment:                           - Prior to the procedure, a History and Physical                            was performed, and patient medications and                            allergies were reviewed. The patient's tolerance of                            previous anesthesia was also reviewed. The risks                            and benefits of the procedure and the sedation                            options and risks were discussed with the patient.                            All questions were answered, and informed consent                            was obtained. Prior Anticoagulants: The patient has                            taken no anticoagulant or antiplatelet agents. ASA                            Grade Assessment: II - A patient with mild systemic                            disease. After reviewing the risks and benefits,                            the patient was deemed in satisfactory condition to                            undergo the procedure.  After obtaining informed consent, the colonoscope                            was passed under direct vision. Throughout the                            procedure, the patient's blood pressure, pulse, and                            oxygen saturations were monitored continuously. The                            Olympus Scope [id number] was introduced through the                            anus and advanced  to the the terminal ileum. The                            colonoscopy was performed without difficulty. The                            patient tolerated the procedure well. The quality                            of the bowel preparation was good. The terminal                            ileum, ileocecal valve, appendiceal orifice, and                            rectum were photographed. Scope In: 9:10:44 AM Scope Out: 9:36:23 AM Scope Withdrawal Time: 0 hours 23 minutes 57 seconds  Total Procedure Duration: 0 hours 25 minutes 39 seconds  Findings:                 Localized severe inflammation characterized by                            congestion (edema), erythema and serpentine                            ulcerations was found in the terminal ileum.                            Concern for possible fistula in the terminal ileum                            as well. Biopsies were taken with a cold forceps                            for histology.                           Localized severe inflammation characterized by  congestion (edema), erosions, erythema and shallow                            ulcerations was found in the cecum. Biopsies were                            taken with a cold forceps for histology.                           A 5 mm polyp was found in the transverse colon. The                            polyp was sessile. The polyp was removed with a                            cold snare. Resection and retrieval were complete.                           Biopsies were taken with a cold forceps in the                            rectum, in the sigmoid colon, in the descending                            colon, in the transverse colon and in the ascending                            colon for histology.                           Non-bleeding internal hemorrhoids were found during                            retroflexion. Complications:            No immediate  complications. Estimated Blood Loss:     Estimated blood loss was minimal. Impression:               - Severe inflammation was found in the ileum, rule                            out Crohn's disease. Biopsied.                           - Localized severe inflammation was found in the                            cecum, rule out Crohn's disease. Biopsied.                           - One 5 mm polyp in the transverse colon, removed                            with a cold snare. Resected and retrieved.                           -  Non-bleeding internal hemorrhoids.                           - Biopsies were taken with a cold forceps for                            histology in the rectum, in the sigmoid colon, in                            the descending colon, in the transverse colon and                            in the ascending colon. Recommendation:           - Discharge patient to home (with escort).                           - Await pathology results.                           - Plan for CT enterography for further evaluation.                           - Return to GI clinic at next available appointment.                           - The findings and recommendations were discussed                            with the patient. Dr Particia Lather "Alan Ripper" Leonides Schanz,  10/09/2022 9:43:05 AM

## 2022-10-09 NOTE — Addendum Note (Signed)
Addended by: Heber Hato [place] A on: 10/09/2022 12:05 PM   Modules accepted: Orders

## 2022-10-09 NOTE — Progress Notes (Signed)
Uneventful anesthetic. Report to pacu rn. Vss. Care resumed by rn. 

## 2022-10-09 NOTE — Patient Instructions (Addendum)
- Discharge patient to home (with escort).                           - Await pathology results.                           - Plan for CT enterography for further evaluation.                           - Return to GI clinic at next available appointment.                           - The findings and recommendations were discussed                            with the patient.  YOU HAD AN ENDOSCOPIC PROCEDURE TODAY AT THE [facility]:   Refer to the procedure report that was given to you for any specific questions about what was found during the examination.  If the procedure report does not answer your questions, please call your gastroenterologist to clarify.  If you requested that your care partner not be given the details of your procedure findings, then the procedure report has been included in a sealed envelope for you to review at your convenience later.  YOU SHOULD EXPECT: Some feelings of bloating in the abdomen. Passage of more gas than usual.  Walking can help get rid of the air that was put into your GI tract during the procedure and reduce the bloating. If you had a lower endoscopy (such as a colonoscopy or flexible sigmoidoscopy) you may notice spotting of blood in your stool or on the toilet paper. If you underwent a bowel prep for your procedure, you may not have a normal bowel movement for a few days.  Please Note:  You might notice some irritation and congestion in your nose or some drainage.  This is from the oxygen used during your procedure.  There is no need for concern and it should clear up in a day or so.  SYMPTOMS TO REPORT IMMEDIATELY:  Following lower endoscopy (colonoscopy or flexible sigmoidoscopy):  Excessive amounts of blood in the stool  Significant tenderness or worsening of abdominal pains  Swelling of the abdomen that is new, acute  Fever of 100F or higher  For urgent or emergent issues, a gastroenterologist can be reached at  any hour by calling [phone]. Do not use MyChart messaging for urgent concerns.    DIET:  We do recommend a small meal at first, but then you may proceed to your regular diet.  Drink plenty of fluids but you should avoid alcoholic beverages for 24 hours.  ACTIVITY:  You should plan to take it easy for the rest of today and you should NOT DRIVE or use heavy machinery until tomorrow (because of the sedation medicines used during the test).    FOLLOW UP: Our staff will call the number listed on your records the next business day following your procedure.  We will call around 7:15- 8:00 am to check on you and address any questions or concerns that you may have regarding the information given to you following your procedure. If we do not reach you, we will leave a message.     If any  biopsies were taken you will be contacted by phone or by letter within the next 1-3 [place].  Please call us at [phone] if you have not heard about the biopsies in [address] [place].    SIGNATURES/CONFIDENTIALITY: You and/or your care partner have signed paperwork which will be entered into your electronic medical record.  These signatures attest to the fact that that the information above on your After Visit Summary has been reviewed and is understood.  Full responsibility of the confidentiality of this discharge information lies with you and/or your care-partner.

## 2022-10-09 NOTE — Progress Notes (Signed)
GASTROENTEROLOGY PROCEDURE H&P NOTE   Primary Care Physician: Mliss Sax, MD    Reason for Procedure:   Colon cancer screening  Plan:    Colonoscopy  Patient is appropriate for endoscopic procedure(s) in the ambulatory ([facility]) setting.  The nature of the procedure, as well as the risks, benefits, and alternatives were carefully and thoroughly reviewed with the patient. Ample time for discussion and questions allowed. The patient understood, was satisfied, and agreed to proceed.     HPI: Joshua Morse is a 57 y.o. male who presents for colonoscopy for colon cancer screening. Denies blood in stools, changes in bowel habits, or unintentional weight loss. Denies family history of colon cancer. This is his first colonoscopy.   Past Medical History:  Diagnosis Date   Arthritis    Diabetes mellitus without complication (HCC)    Pre-diabetic   Gout    Mass of left parotid gland     Past Surgical History:  Procedure Laterality Date   PAROTIDECTOMY Left 12/09/2019   Procedure: LEFT TOTALPAROTIDECTOMY;  Surgeon: Newman Pies, MD;  Location: [facility];  Service: ENT;  Laterality: Left;  REQUESTING  RNFA FOR START   TONSILLECTOMY      Prior to Admission medications   Medication Sig Start Date End Date Taking? Authorizing Provider  metFORMIN (GLUCOPHAGE-XR) 500 MG 24 hr tablet Take 1 tablet (500 mg total) by mouth 2 (two) times daily with a meal. 08/17/22  Yes Mliss Sax, MD  colchicine 0.6 MG tablet Take 1.2mg  initially followed by 0.6mg  1 hour later.  Continue 0.6mg  daily until resolution Patient taking differently: Take 1.2mg  initially followed by 0.6mg  1 hour later.  Continue 0.6mg  daily until resolution As needed 11/25/21   Mliss Sax, MD  indomethacin (INDOCIN) 50 MG capsule TAKE 1 CAPSULE BY MOUTH THREE TIMES DAILY WITH MEALS AS NEEDED FOR GOUTY ATTACKS Patient taking differently: As needed 12/13/21   Mliss Sax, MD   triamcinolone cream (KENALOG) 0.1 % Applied to rash on legs twice daily as needed. 06/08/21   Mliss Sax, MD    Current Outpatient Medications  Medication Sig Dispense Refill   metFORMIN (GLUCOPHAGE-XR) 500 MG 24 hr tablet Take 1 tablet (500 mg total) by mouth 2 (two) times daily with a meal. 180 tablet 1   colchicine 0.6 MG tablet Take 1.2mg  initially followed by 0.6mg  1 hour later.  Continue 0.6mg  daily until resolution (Patient taking differently: Take 1.2mg  initially followed by 0.6mg  1 hour later.  Continue 0.6mg  daily until resolution As needed) 30 tablet 2   indomethacin (INDOCIN) 50 MG capsule TAKE 1 CAPSULE BY MOUTH THREE TIMES DAILY WITH MEALS AS NEEDED FOR GOUTY ATTACKS (Patient taking differently: As needed) 30 capsule 0   triamcinolone cream (KENALOG) 0.1 % Applied to rash on legs twice daily as needed. 80 g 0   Current Facility-Administered Medications  Medication Dose Route Frequency Provider Last Rate Last Admin   0.9 %  sodium chloride infusion  500 mL Intravenous Continuous Imogene Burn, MD        Allergies as of 10/09/2022   (No Known Allergies)    Family History  Problem Relation Age of Onset   Healthy Mother    Gout Maternal Grandmother    Gout Paternal Grandfather    Colon cancer Neg Hx    Colon polyps Neg Hx    Esophageal cancer Neg Hx    Rectal cancer Neg Hx    Stomach cancer Neg Hx  Social History   Socioeconomic History   Marital status: Married    Spouse name: Not on file   Number of children: Not on file   Years of education: Not on file   Highest education level: Not on file  Occupational History   Not on file  Tobacco Use   Smoking status: Former    Types: Cigarettes    Quit date: 1999    Years since quitting: 25.5   Smokeless tobacco: Never  Vaping Use   Vaping Use: Never used  Substance and Sexual Activity   Alcohol use: Yes    Comment: social   Drug use: Never   Sexual activity: Yes  Other Topics Concern   Not  on file  Social History Narrative   Not on file   Social Determinants of Health   Financial Resource Strain: Not on file  Food Insecurity: Not on file  Transportation Needs: Not on file  Physical Activity: Not on file  Stress: Not on file  Social Connections: Not on file  Intimate Partner Violence: Not on file    Physical Exam: Vital signs in last 24 hours: BP 135/75   Pulse 81   Temp 97.8 F (36.6 C)   Ht 6' (1.829 m)   Wt 212 lb (96.2 kg)   SpO2 98%   BMI 28.75 kg/m  GEN: NAD EYE: Sclerae anicteric ENT: MMM CV: Non-tachycardic Pulm: No increased work of breathing GI: Soft, NT/ND NEURO:  Alert & Oriented   Eulah Pont, MD [facility]  10/09/2022 8:55 AM

## 2022-10-09 NOTE — Progress Notes (Signed)
Called to room to assist during endoscopic procedure.  Patient ID and intended procedure confirmed with present staff. Received instructions for my participation in the procedure from the performing physician.  

## 2022-10-10 ENCOUNTER — Telehealth: Payer: Self-pay

## 2022-10-10 NOTE — Telephone Encounter (Signed)
  Follow up Call-     10/09/2022    8:08 AM  Call back number  Post procedure Call Back phone  # [phone]  Permission to leave phone message Yes     Patient questions:  Do you have a fever, pain , or abdominal swelling? No. Pain Score  0 *  Have you tolerated food without any problems? Yes.    Have you been able to return to your normal activities? Yes.    Do you have any questions about your discharge instructions: Diet   No. Medications  No. Follow up visit  No.  Do you have questions or concerns about your Care? No.  Actions: * If pain score is 4 or above: No action needed, pain <4.

## 2022-10-16 ENCOUNTER — Encounter: Payer: Self-pay | Admitting: Internal Medicine

## 2022-10-26 ENCOUNTER — Ambulatory Visit (HOSPITAL_COMMUNITY)
Admission: RE | Admit: 2022-10-26 | Discharge: 2022-10-26 | Disposition: A | Discharge status: Home / Self Care | Payer: BC Managed Care – PPO | Source: Ambulatory Visit | Attending: Internal Medicine | Admitting: Internal Medicine

## 2022-10-26 DIAGNOSIS — R194 Change in bowel habit: Secondary | ICD-10-CM | POA: Insufficient documentation

## 2022-10-26 DIAGNOSIS — K529 Noninfective gastroenteritis and colitis, unspecified: Secondary | ICD-10-CM | POA: Diagnosis present

## 2022-10-26 MED ORDER — BARIUM SULFATE 0.1 % PO SUSP
1350.0000 mL | Freq: Once | ORAL | Status: AC
  Administered 2022-10-26: 1350 mL via ORAL

## 2022-10-26 MED ORDER — BARIUM SULFATE 0.1 % PO SUSP
ORAL | Status: AC
  Filled 2022-10-26: qty 3

## 2022-10-26 MED ORDER — SODIUM CHLORIDE (PF) 0.9 % IJ SOLN
INTRAMUSCULAR | Status: AC
  Filled 2022-10-26: qty 50

## 2022-10-26 MED ORDER — IOHEXOL 300 MG/ML  SOLN
100.0000 mL | Freq: Once | INTRAMUSCULAR | Status: AC | PRN
  Administered 2022-10-26: 100 mL via INTRAVENOUS

## 2022-11-08 ENCOUNTER — Other Ambulatory Visit: Payer: Self-pay | Admitting: Family Medicine

## 2022-11-08 DIAGNOSIS — Z8739 Personal history of other diseases of the musculoskeletal system and connective tissue: Secondary | ICD-10-CM

## 2022-11-18 ENCOUNTER — Other Ambulatory Visit: Payer: Self-pay | Admitting: Family Medicine

## 2022-11-18 DIAGNOSIS — Z8739 Personal history of other diseases of the musculoskeletal system and connective tissue: Secondary | ICD-10-CM

## 2023-01-17 ENCOUNTER — Encounter: Payer: Self-pay | Admitting: Internal Medicine

## 2023-01-17 ENCOUNTER — Ambulatory Visit: Payer: BC Managed Care – PPO | Admitting: Internal Medicine

## 2023-01-17 VITALS — BP 124/80 | HR 83 | Ht 72.0 in | Wt 216.0 lb

## 2023-01-17 DIAGNOSIS — K529 Noninfective gastroenteritis and colitis, unspecified: Secondary | ICD-10-CM | POA: Diagnosis not present

## 2023-01-17 DIAGNOSIS — Z860100 Personal history of colon polyps, unspecified: Secondary | ICD-10-CM | POA: Diagnosis not present

## 2023-01-17 NOTE — Patient Instructions (Signed)
  If your blood pressure at your visit was 140/90 or greater, please contact your primary care physician to follow up on this.  _______________________________________________________  If you are age 59 or older, your body mass index should be between 23-30. Your Body mass index is 29.29 kg/m. If this is out of the aforementioned range listed, please consider follow up with your Primary Care Provider.  If you are age 74 or younger, your body mass index should be between 19-25. Your Body mass index is 29.29 kg/m. If this is out of the aformentioned range listed, please consider follow up with your Primary Care Provider.   ________________________________________________________  The [facility] providers would like to encourage you to use [facility] to communicate with providers for non-urgent requests or questions.  Due to long hold times on the telephone, sending your provider a message by [facility] may be a faster and more efficient way to get a response.  Please allow 48 business hours for a response.  Please remember that this is for non-urgent requests.  _______________________________________________________   Thank you for entrusting me with your care and for choosing [facility], Dr. Eulah Pont

## 2023-01-17 NOTE — Progress Notes (Signed)
Chief Complaint: Ileocolitis  HPI : 57 year old male with history of DM and gout presents to follow up on ileocolitis seen on colonoscopy  Denies blood in the stools, diarrhea, abdominal pain, or weight loss. Denies N&V or dysphagia. Occasional heartburn at night time for which he takes Tums, which is effective. Denies family history of IBD.  Denies use of OTC NSAIDs. For gout flares, he does use indomethacin and colchicine two times per year. When he has a flare, he does take a substantial amount of indomethacin. When he takes colchicine, he will have to go to bathroom more frequently. Gout flares are usually provoked by stress and a change in his eating habits, particularly when the [employer] occurs twice yearly  Past Medical History:  Diagnosis Date   Arthritis    Diabetes mellitus without complication (HCC)    Pre-diabetic   Gout    Mass of left parotid gland      Past Surgical History:  Procedure Laterality Date   PAROTIDECTOMY Left 12/09/2019   Procedure: LEFT TOTALPAROTIDECTOMY;  Surgeon: Newman Pies, MD;  Location: [facility];  Service: ENT;  Laterality: Left;  REQUESTING  RNFA FOR START   TONSILLECTOMY     Family History  Problem Relation Age of Onset   Healthy Mother    Gout Maternal Grandmother    Gout Paternal Grandfather    Colon cancer Neg Hx    Colon polyps Neg Hx    Esophageal cancer Neg Hx    Rectal cancer Neg Hx    Stomach cancer Neg Hx    Social History   Tobacco Use   Smoking status: Former    Current packs/day: 0.00    Types: Cigarettes    Quit date: 1999    Years since quitting: 25.7   Smokeless tobacco: Never  Vaping Use   Vaping status: Never Used  Substance Use Topics   Alcohol use: Yes    Comment: social   Drug use: Never   Current Outpatient Medications  Medication Sig Dispense Refill   metFORMIN (GLUCOPHAGE-XR) 500 MG 24 hr tablet Take 1 tablet (500 mg total) by mouth 2 (two) times daily with a meal. 180  tablet 1   colchicine 0.6 MG tablet Take 1.2mg  initially followed by 0.6mg  1 hour later.  Continue 0.6mg  daily until resolution (Patient not taking: Reported on 01/17/2023) 30 tablet 2   indomethacin (INDOCIN) 50 MG capsule TAKE 1 CAPSULE BY MOUTH THREE TIMES DAILY AS NEEDED FOR GOUT ATTACKS (Patient not taking: Reported on 01/17/2023) 30 capsule 0   triamcinolone cream (KENALOG) 0.1 % Applied to rash on legs twice daily as needed. (Patient not taking: Reported on 01/17/2023) 80 g 0   No current facility-administered medications for this visit.   No Known Allergies   Review of Systems: All systems reviewed and negative except where noted in HPI.   Physical Exam: BP 124/80   Pulse 83   Ht 6' (1.829 m)   Wt 216 lb (98 kg)   BMI 29.29 kg/m  Constitutional: Pleasant,well-developed, male in no acute distress. HEENT: Normocephalic and atraumatic. Conjunctivae are normal. No scleral icterus. Cardiovascular: Normal rate, regular rhythm.  Pulmonary/chest: Effort normal and breath sounds normal. No wheezing, rales or rhonchi. Abdominal: Soft, nondistended, nontender. Bowel sounds active throughout. There are no masses palpable. No hepatomegaly. Extremities: No edema Neurological: Alert and oriented to person place and time. Skin: Skin is warm and dry. No rashes noted. Psychiatric: Normal mood and affect.  Behavior is normal.  Labs 08/2022: CBC normal.  CMP unremarkable.  CT enterography 10/26/22: IMPRESSION: Mild mucosal thickening in the terminal ileum and proximal ascending colon without inflammatory changes or fold thickening. No fistula formation suggested in the adjacent mesentery. There is a 6 cm long segment of ascending colon just distal to the ileocecal valve with significant intramural fat. This can be seen in the presence of a previous chronic inflammatory state but is nonspecific. Overall please correlate with findings at colonoscopy No obstruction, free air or free  fluid. Fatty liver infiltration.  Colonoscopy 10/09/22: - Severe inflammation was found in the ileum, rule out Crohn' s disease. Biopsied. - Localized severe inflammation was found in the cecum, rule out Crohn' s disease. Biopsied.  - One 5 mm polyp in the transverse colon, removed with a cold snare. Resected and retrieved. - Non- bleeding internal hemorrhoids.  - Biopsies were taken with a cold forceps for histology in the rectum, in the sigmoid colon, in the descending colon, in the transverse colon and in the ascending colon. Path: 1. Surgical [P], small bowel, ileal - ACUTE NON SPECIFIC ILEITIS (SEE NOTE) 2. Surgical [P], colon, cecum - ACUTE MILDLY ACTIVE COLITIS (SEE NOTE) 3. Surgical [P], colon, transverse, polyp (1) - SESSILE SERRATED POLYP. - NO DYSPLASIA OR MALIGNANCY. 4. Surgical [P], right and left colon - BENIGN COLONIC MUCOSA WITH NO SPECIFIC PATHOLOGIC CHANGES - NEGATIVE FOR FEATURES OF CHRONICITY OR ACUTE INFLAMMATION - NEGATIVE FOR INCREASED INTRAEPITHELIAL LYMPHOCYTES OR THICKENED SUBEPITHELIAL COLLAGEN TABLE - NEGATIVE FOR DYSPLASIA OR MALIGNANCY Diagnosis Note 2. The colon biopsies show a mildly active colitis . There are also features of chronicity including mild crypt architectural distortion and a mild basal lymphoplasmacytosis. No granulomas or viral cytopathic effect are identified. There is no evidence of dysplasia. the findings are non specific and etiologies including medication-effect (particularly NSAIDs) and an infection may be considered. Inflammatory bowel disease is less likely but cannot be excluded. Clinical and endoscopic correlation is recommended.  ASSESSMENT AND PLAN: Ileocolitis History of colon polyp Patient had a screening colonoscopy in 10/2022 and was found to have significant inflammation in his ileum and cecum.  Biopsies confirmed acute ileitis and colitis in the ileum and cecum.  Biopsies and the rest of the colon were normal.  A follow-up CT  enterography showed some mild mucosal thickening in the ileum and proximal right colon.  Differential for the inflammation includes NSAID use, infection, or Crohn's disease.   I discussed these possibilities with the patient today, and he does endorse significant indomethacin use prior to his colonoscopy for a gout flare. Patient is asymptomatic from a GI standpoint.  I did discuss the option of starting therapy for Crohn's disease, and he would like to hold off for now.  Will plan for an interval colonoscopy in 6 months to see if the inflammation is still present.  I did ask the patient to reschedule the colonoscopy if he takes it indomethacin prior to the procedure. - Recall for colonoscopy [facility] in 6 months  Eulah Pont, MD  I spent 33 minutes of time, including in depth chart review, independent review of results as outlined above, communicating results with the patient directly, face-to-face time with the patient, coordinating care, ordering studies and medications as appropriate, and documentation.

## 2023-02-12 ENCOUNTER — Encounter: Payer: Self-pay | Admitting: Family Medicine

## 2023-02-12 ENCOUNTER — Ambulatory Visit: Payer: BC Managed Care – PPO | Admitting: Family Medicine

## 2023-02-12 VITALS — BP 130/82 | HR 54 | Temp 97.6°F | Ht 72.0 in | Wt 218.0 lb

## 2023-02-12 DIAGNOSIS — G542 Cervical root disorders, not elsewhere classified: Secondary | ICD-10-CM | POA: Diagnosis not present

## 2023-02-12 DIAGNOSIS — R7303 Prediabetes: Secondary | ICD-10-CM

## 2023-02-12 DIAGNOSIS — E7800 Pure hypercholesterolemia, unspecified: Secondary | ICD-10-CM

## 2023-02-12 DIAGNOSIS — Z8739 Personal history of other diseases of the musculoskeletal system and connective tissue: Secondary | ICD-10-CM | POA: Diagnosis not present

## 2023-02-12 DIAGNOSIS — Z23 Encounter for immunization: Secondary | ICD-10-CM

## 2023-02-12 LAB — BASIC METABOLIC PANEL
BUN: 14 mg/dL (ref 6–23)
CO2: 27 meq/L (ref 19–32)
Calcium: 9.9 mg/dL (ref 8.4–10.5)
Chloride: 103 meq/L (ref 96–112)
Creatinine, Ser: 0.85 mg/dL (ref 0.40–1.50)
GFR: 95.15 mL/min (ref >60.00)
Glucose, Bld: 127 mg/dL — ABNORMAL HIGH (ref 70–99)
Potassium: 4.1 meq/L (ref 3.5–5.1)
Sodium: 139 meq/L (ref 135–145)

## 2023-02-12 LAB — HEMOGLOBIN A1C: Hgb A1c MFr Bld: 6.6 % — ABNORMAL HIGH (ref 4.6–6.5)

## 2023-02-12 LAB — URIC ACID: Uric Acid, Serum: 6.9 mg/dL (ref 4.0–7.8)

## 2023-02-12 MED ORDER — ATORVASTATIN CALCIUM 10 MG PO TABS
10.0000 mg | ORAL_TABLET | Freq: Every day | ORAL | 3 refills | Status: DC
Start: 2023-02-12 — End: 2024-02-08

## 2023-02-12 NOTE — Progress Notes (Signed)
Established Patient Office Visit   Subjective:  Patient ID: Joshua Morse, male    DOB: 11-13-1965  Age: 57 y.o. MRN: [id number]  Chief Complaint  Patient presents with   Medical Management of Chronic Issues    6 month follow up.Pt is not fasting. Pt complains of head pain that radiates to back of neck and right shoulder x 2 [place]. Left arm muscle spasms x 6+ months. Flu shot today.     HPI Encounter Diagnoses  Name Primary?   Pre-diabetes Yes   Elevated cholesterol    Need for immunization against influenza    Cervical neuropathy    History of gout    Follow-up of above.  Doing well with higher dose of metformin at 500 twice daily.  Continues to work on lowering the purines in his diet.  Has not had a gouty attack since.  Status post colonoscopy this past July.  He has developed a pain in his right lateral posterior neck that radiates down into his arm.  He is right-hand dominant.  However, sometimes the pain is on the left side of his neck.  Denies weakness in his upper extremities or lower extremities.  He wonders if stress is a component.   Review of Systems  Constitutional: Negative.   HENT: Negative.    Eyes:  Negative for blurred vision, discharge and redness.  Respiratory: Negative.    Cardiovascular: Negative.   Gastrointestinal:  Negative for abdominal pain.  Genitourinary: Negative.   Musculoskeletal:  Positive for neck pain. Negative for myalgias.  Skin:  Negative for rash.  Neurological:  Negative for tingling, loss of consciousness and weakness.  Endo/Heme/Allergies:  Negative for polydipsia.     Current Outpatient Medications:    atorvastatin (LIPITOR) 10 MG tablet, Take 1 tablet (10 mg total) by mouth daily., Disp: 90 tablet, Rfl: 3   colchicine 0.6 MG tablet, Take 1.2mg  initially followed by 0.6mg  1 hour later.  Continue 0.6mg  daily until resolution, Disp: 30 tablet, Rfl: 2   indomethacin (INDOCIN) 50 MG capsule, TAKE 1 CAPSULE BY MOUTH THREE TIMES DAILY AS  NEEDED FOR GOUT ATTACKS, Disp: 30 capsule, Rfl: 0   metFORMIN (GLUCOPHAGE-XR) 500 MG 24 hr tablet, Take 1 tablet (500 mg total) by mouth 2 (two) times daily with a meal., Disp: 180 tablet, Rfl: 1   triamcinolone cream (KENALOG) 0.1 %, Applied to rash on legs twice daily as needed. (Patient not taking: Reported on 01/17/2023), Disp: 80 g, Rfl: 0   Objective:     BP 130/82   Pulse (!) 54   Temp 97.6 F (36.4 C)   Ht 6' (1.829 m)   Wt 218 lb (98.9 kg)   SpO2 98%   BMI 29.57 kg/m  BP Readings from Last 3 Encounters:  02/12/23 130/82  01/17/23 124/80  10/09/22 104/67   Wt Readings from Last 3 Encounters:  02/12/23 218 lb (98.9 kg)  01/17/23 216 lb (98 kg)  10/09/22 212 lb (96.2 kg)      Physical Exam Constitutional:      General: He is not in acute distress.    Appearance: Normal appearance. He is not ill-appearing, toxic-appearing or diaphoretic.  HENT:     Head: Normocephalic and atraumatic.     Right Ear: External ear normal.     Left Ear: External ear normal.     Mouth/Throat:     Mouth: Mucous membranes are moist.     Pharynx: Oropharynx is clear. No oropharyngeal exudate or posterior oropharyngeal erythema.  Eyes:     General: No scleral icterus.       Right eye: No discharge.        Left eye: No discharge.     Extraocular Movements: Extraocular movements intact.     Conjunctiva/sclera: Conjunctivae normal.     Pupils: Pupils are equal, round, and reactive to light.  Cardiovascular:     Rate and Rhythm: Normal rate and regular rhythm.  Pulmonary:     Effort: Pulmonary effort is normal. No respiratory distress.     Breath sounds: Normal breath sounds.  Abdominal:     General: Bowel sounds are normal.     Tenderness: There is no abdominal tenderness. There is no guarding.  Musculoskeletal:     Cervical back: No rigidity, tenderness or bony tenderness. No pain with movement. Decreased range of motion.     Comments:   Decreased rotation to left and right.  Negative  Spurling's to the left and right.  Skin:    General: Skin is warm and dry.  Neurological:     Mental Status: He is alert and oriented to person, place, and time.  Psychiatric:        Mood and Affect: Mood normal.        Behavior: Behavior normal.      No results found for any visits on 02/12/23.    The 10-year ASCVD risk score (Arnett DK, et al., 2019) is: 8.9%    Assessment & Plan:   Pre-diabetes -     Basic metabolic panel -     Hemoglobin A1c  Elevated cholesterol -     Atorvastatin Calcium; Take 1 tablet (10 mg total) by mouth daily.  Dispense: 90 tablet; Refill: 3  Need for immunization against influenza -     Flu vaccine trivalent PF, 6mos and older(Flulaval,Afluria,Fluarix,Fluzone)  Cervical neuropathy -     Ambulatory referral to Sports Medicine  History of gout -     Uric acid    Return in about 6 months (around 08/12/2023).  With history of prediabetes we discussed using a statin secondary to his elevated risk of vascular disease.  He will report unusual myalgias.  Continue all other medications  Mliss Sax, MD

## 2023-02-15 ENCOUNTER — Other Ambulatory Visit: Payer: Self-pay | Admitting: Family Medicine

## 2023-02-15 DIAGNOSIS — R7303 Prediabetes: Secondary | ICD-10-CM

## 2023-02-27 ENCOUNTER — Encounter: Payer: Self-pay | Admitting: Sports Medicine

## 2023-02-27 ENCOUNTER — Ambulatory Visit: Payer: BC Managed Care – PPO | Admitting: Sports Medicine

## 2023-02-27 ENCOUNTER — Ambulatory Visit (HOSPITAL_BASED_OUTPATIENT_CLINIC_OR_DEPARTMENT_OTHER)
Admission: RE | Admit: 2023-02-27 | Discharge: 2023-02-27 | Disposition: A | Discharge status: Home / Self Care | Payer: BC Managed Care – PPO | Source: Ambulatory Visit | Attending: Sports Medicine | Admitting: Sports Medicine

## 2023-02-27 VITALS — BP 134/88 | Ht 72.0 in | Wt 218.0 lb

## 2023-02-27 DIAGNOSIS — M5412 Radiculopathy, cervical region: Secondary | ICD-10-CM | POA: Diagnosis not present

## 2023-02-27 NOTE — Progress Notes (Signed)
   Subjective:    Patient ID: Joshua Morse, male    DOB: 1965-11-29, 57 y.o.   MRN: [id number]  HPI chief complaint: Neck pain  Patient is a very pleasant right-hand-dominant 57 year old male that presents today with a little over a month of neck pain and tightness.  The symptoms are intermittent but appear to be worse with sitting.  They do improve  with walking.  The symptoms do radiate into his shoulders bilaterally.  He also has some associated numbness and tingling.  He denies nighttime pain.  He denies similar occurrences in the past.  He takes an occasional Tylenol which is helpful.  Past medical history reviewed Medications reviewed Allergies reviewed   Review of Systems As above    Objective:   Physical Exam  Well-developed, well-nourished.  No acute distress  Cervical spine: Good cervical range of motion.  He does have some reproducible tightness with cervical rotation to the right.  No tenderness to palpation along the midline.  No palpable spasm.  Neurological exam shows 5/5 strength in both upper extremities.  Reflexes are equal at the biceps, triceps, and brachioradialis tendons bilaterally.  Good pulses.  X-rays of the cervical spine including AP and lateral views show moderate disc space narrowing at C5-C6 and C6-C7.  There are facet changes at these levels as well.     Assessment & Plan:   Neck pain secondary to cervical degenerative disc disease  I recommended a trial of physical therapy.  May wean to a home exercise program per the therapist discretion.  Continue with Tylenol as needed for pain.  If symptoms persist or worsen consider further diagnostic imaging at that time.  Follow-up for ongoing or recalcitrant issues.  This note was dictated using Dragon naturally speaking software and may contain errors in syntax, spelling, or content which have not been identified prior to signing this note.

## 2023-03-26 NOTE — Therapy (Signed)
OUTPATIENT PHYSICAL THERAPY CERVICAL EVALUATION   Patient Name: Joshua Morse MRN: [id number] DOB:28-Nov-1965, 57 y.o., male Today's Date: 03/28/2023  END OF SESSION:  PT End of Session - 03/28/23 1148     Visit Number 1    Date for PT Re-Evaluation 05/09/23    Authorization Type BCBS VL: 30    PT Start Time 1148    PT Stop Time 1225    PT Time Calculation (min) 37 min    Activity Tolerance Patient tolerated treatment well    Behavior During Therapy WFL for tasks assessed/performed             Past Medical History:  Diagnosis Date   Arthritis    Diabetes mellitus without complication (HCC)    Pre-diabetic   Gout    Mass of left parotid gland    Past Surgical History:  Procedure Laterality Date   PAROTIDECTOMY Left 12/09/2019   Procedure: LEFT TOTALPAROTIDECTOMY;  Surgeon: Newman Pies, MD;  Location: [facility];  Service: ENT;  Laterality: Left;  REQUESTING  RNFA FOR START   TONSILLECTOMY     Patient Active Problem List   Diagnosis Date Noted   Lipoma of torso 06/08/2021   Need for shingles vaccine 06/08/2021   Hemorrhoids 06/08/2021   Change in bowel habits 06/08/2021   History of parotidectomy 12/09/2019   Sialadenitis 11/27/2019   History of gout 11/27/2019   Elevated cholesterol 10/14/2018   Elevated blood sugar 10/14/2018   Healthcare maintenance 04/16/2018   Left facial swelling 04/16/2018   Eczema 04/16/2018   Acute idiopathic gout of left ankle 02/15/2018   Degenerative disc disease, thoracic 05/24/2015    PCP: Mliss Sax, MD   REFERRING PROVIDER: Reino Bellis, MD  REFERRING DIAG: [id number] (ICD-10-CM) - Cervical radiculopathy   THERAPY DIAG:  Cervicalgia  Cramp and spasm  Abnormal posture  Rationale for Evaluation and Treatment: Rehabilitation  ONSET DATE: 1 month ago  SUBJECTIVE:                                                                                                                                                                                                          SUBJECTIVE STATEMENT: Started having tightness in back of neck a month ago, kind of comes and goes, it will be in different spots, can't sit for long periods, difficulty sleeping, someitmes wakes in middle of night. Denies numbness/tingling.  Hand dominance: Right  PERTINENT HISTORY:  Tumor resection L SCM (nonmalignant- 2020?)  PAIN:  Are you having pain? Yes: NPRS scale: 4-5/10 Pain location: back  of neck into shoulders  Pain description: tight, dull throb Aggravating factors: sitting, inactivity Relieving factors: moving around, heat  PRECAUTIONS: None  RED FLAGS: None     WEIGHT BEARING RESTRICTIONS: No  FALLS:  Has patient fallen in last 6 months? No  OCCUPATION: VP for sales for company in [place]  PLOF: Independent and Leisure: golf on weekends, walk daily 2 miles/day   PATIENT GOALS: decrease pain  NEXT MD VISIT: none scheduled.   OBJECTIVE:  Note: Objective measures were completed at Evaluation unless otherwise noted.  DIAGNOSTIC FINDINGS:  02/27/2023 IMPRESSION: Moderate degenerative changes of the cervical spine most pronounced at C5-6 and C6-7.  PATIENT SURVEYS:  NDI 14/50  COGNITION: Overall cognitive status: Within functional limits for tasks assessed  SENSATION: WFL  POSTURE: rounded shoulders, forward head, and increased thoracic kyphosis  PALPATION: Tenderness R levator scapulae, tightness bil UT, levators, cervical paraspinals, thoracic and lumbar erector spinae   CERVICAL ROM:   Active ROM A/PROM (deg) eval  Flexion 50  Extension 15  Right lateral flexion 18  Left lateral flexion 22  Right rotation 65  Left rotation 50   (Blank rows = not tested)  UPPER EXTREMITY ROM:  WNL, full pain free and symmetric   UPPER EXTREMITY MMT:  MMT Right eval Left eval  Shoulder flexion 5 5  Shoulder extension    Shoulder abduction 5 5  Shoulder adduction    Shoulder  extension    Shoulder internal rotation 5 5  Shoulder external rotation 5 5  Middle trapezius    Lower trapezius    Elbow flexion 5 5  Elbow extension 5 5  Wrist flexion 5 5  Wrist extension 5 5  Wrist ulnar deviation    Wrist radial deviation    Wrist pronation    Wrist supination    Grip strength 80 70   (Blank rows = not tested)  CERVICAL SPECIAL TESTS:  Spurling's test: Negative and Distraction test: Positive  TODAY'S TREATMENT:                                                                                                                              DATE:   03/28/23 EVAL Self Care: Education on findings, POC including TrDN, posture, office posture and set-up, initial HEP (see HEP). Prone on elbows x 1 min with neck extension x 10.   PATIENT EDUCATION:  Education details: see self care Person educated: Patient Education method: Explanation, Demonstration, Verbal cues, and Handouts Education comprehension: verbalized understanding and returned demonstration  HOME EXERCISE PROGRAM: Access Code: [email or web address] URL: [email or web address] Date: 03/28/2023 Prepared by: Harrie Foreman  Exercises - Seated Cervical Retraction  - 3 x daily - 7 x weekly - 1 sets - 10 reps - Seated Scapular Retraction  - 3 x daily - 7 x weekly - 1 sets - 10 reps - Seated Shoulder Rolls  - 3 x daily - 7 x weekly - 1 sets - 10 reps -  Gentle Levator Scapulae Stretch  - 1 x daily - 7 x weekly - 1 sets - 3 reps - 10-30 sec hold - Seated Gentle Upper Trapezius Stretch  - 1 x daily - 7 x weekly - 1 sets - 3 reps - 10-30 sec hold - Sternocleidomastoid Stretch  - 1 x daily - 7 x weekly - 1 sets - 3 reps - 10-30 sec hold - Doorway Pec Stretch at 60 Elevation  - 1 x daily - 7 x weekly - 1 sets - 3 reps - 30 sec  hold  Patient Education - Office Posture  ASSESSMENT:  CLINICAL IMPRESSION: Patient is a 57 y.o. right hand dominant male who was seen today for physical therapy evaluation and  treatment for cervical radiculopathy.  He reports primarily neck pain and spasm in upper shoulders and down back, denies any numbness or tingling down arms.  He demonstrated good strength in arms, no shoulder ROM limitations, but had significant limitations in cervical AROM and forward head posture with increased thoracic kyphosis.  Educated today on recommendations for office posture, given HEP for postural breaks and stretches for levator and SCM, also recommended laying prone on elbows 1-3 min/night to start working on kyphotic posture as well as relax low back (reported did decrease complaint of low back tightness today).  We also discussed TrDN as had palpable TrP in R levator, will do next session. Tyliek Boling will benefit from skilled physical therapy to decrease pain, improve posture, and improve activity tolerance so he can return to all activities without limitation.     OBJECTIVE IMPAIRMENTS: decreased activity tolerance, decreased ROM, increased fascial restrictions, impaired perceived functional ability, increased muscle spasms, impaired flexibility, postural dysfunction, and pain.   ACTIVITY LIMITATIONS: carrying, lifting, bending, sitting, and sleeping  PARTICIPATION LIMITATIONS: driving, community activity, and occupation  PERSONAL FACTORS: Time since onset of injury/illness/exacerbation are also affecting patient's functional outcome.   REHAB POTENTIAL: Excellent  CLINICAL DECISION MAKING: Stable/uncomplicated  EVALUATION COMPLEXITY: Low   GOALS: Goals reviewed with patient? Yes  SHORT TERM GOALS: Target date: 04/11/2023   Patient will be independent with initial HEP.  Baseline: given Goal status: INITIAL  LONG TERM GOALS: Target date: 05/09/2023   Patient will be independent with advanced/ongoing HEP to improve outcomes and carryover.  Baseline:  Goal status: INITIAL  2.  Patient will report 75% improvement in neck pain to improve QOL.  Baseline:  Goal status:  INITIAL  3.  Patient will demonstrate 60 deg cervical rotation for safety with driving.  Baseline: see objective Goal status: INITIAL  4.  Patient will report <8/50 on NDI to demonstrate improved functional ability.  Baseline: 14/50 Goal status: INITIAL  5.  Patient will be able to play golf without limitation from neck pain.    Baseline: stopped Goal status: INITIAL   PLAN:  PT FREQUENCY: 1-2x/week  PT DURATION: 6 [place]  PLANNED INTERVENTIONS: 97110-Therapeutic exercises, 97530- Therapeutic activity, [id number]- Neuromuscular re-education, 97535- Self Care, 96045- Manual therapy, [id number]- Electrical stimulation (unattended), [id number]- Electrical stimulation (manual), [id number]- Ultrasound, [id number]- Traction (mechanical), Patient/Family education, Taping, Dry Needling, Joint mobilization, Joint manipulation, Spinal manipulation, Spinal mobilization, Cryotherapy, and Moist heat  PLAN FOR NEXT SESSION: progress HEP - add thoracic mobilization, postural strengthening, manual therapy - TrDN, modalities PRN   Jena Gauss, PT, DPT  03/28/2023, 2:43 PM

## 2023-03-28 ENCOUNTER — Ambulatory Visit: Payer: BC Managed Care – PPO | Attending: Family Medicine | Admitting: Physical Therapy

## 2023-03-28 ENCOUNTER — Encounter: Payer: Self-pay | Admitting: Physical Therapy

## 2023-03-28 ENCOUNTER — Other Ambulatory Visit: Payer: Self-pay

## 2023-03-28 DIAGNOSIS — R293 Abnormal posture: Secondary | ICD-10-CM | POA: Diagnosis present

## 2023-03-28 DIAGNOSIS — M542 Cervicalgia: Secondary | ICD-10-CM | POA: Insufficient documentation

## 2023-03-28 DIAGNOSIS — R252 Cramp and spasm: Secondary | ICD-10-CM | POA: Diagnosis present

## 2023-03-28 DIAGNOSIS — M5412 Radiculopathy, cervical region: Secondary | ICD-10-CM | POA: Diagnosis not present

## 2023-04-18 ENCOUNTER — Ambulatory Visit: Payer: BC Managed Care – PPO | Attending: Family Medicine | Admitting: Physical Therapy

## 2023-04-18 DIAGNOSIS — M542 Cervicalgia: Secondary | ICD-10-CM | POA: Insufficient documentation

## 2023-04-18 DIAGNOSIS — R293 Abnormal posture: Secondary | ICD-10-CM | POA: Diagnosis present

## 2023-04-18 DIAGNOSIS — R252 Cramp and spasm: Secondary | ICD-10-CM | POA: Diagnosis present

## 2023-04-18 NOTE — Therapy (Signed)
 OUTPATIENT PHYSICAL THERAPY CERVICAL TREATMENT   Patient Name: Sayf Kerner MRN: [id number] DOB:Aug 28, 1965, 58 y.o., male Today's Date: 04/18/2023  END OF SESSION:  PT End of Session - 04/18/23 0938     Visit Number 2    Date for PT Re-Evaluation 05/09/23    Authorization Type BCBS VL: 30    PT Start Time 0934    PT Stop Time 1016    PT Time Calculation (min) 42 min    Activity Tolerance Patient tolerated treatment well    Behavior During Therapy WFL for tasks assessed/performed             Past Medical History:  Diagnosis Date   Arthritis    Diabetes mellitus without complication (HCC)    Pre-diabetic   Gout    Mass of left parotid gland    Past Surgical History:  Procedure Laterality Date   PAROTIDECTOMY Left 12/09/2019   Procedure: LEFT TOTALPAROTIDECTOMY;  Surgeon: Karis Clunes, MD;  Location: [facility];  Service: ENT;  Laterality: Left;  REQUESTING  RNFA FOR START   TONSILLECTOMY     Patient Active Problem List   Diagnosis Date Noted   Lipoma of torso 06/08/2021   Need for shingles vaccine 06/08/2021   Hemorrhoids 06/08/2021   Change in bowel habits 06/08/2021   History of parotidectomy 12/09/2019   Sialadenitis 11/27/2019   History of gout 11/27/2019   Elevated cholesterol 10/14/2018   Elevated blood sugar 10/14/2018   Healthcare maintenance 04/16/2018   Left facial swelling 04/16/2018   Eczema 04/16/2018   Acute idiopathic gout of left ankle 02/15/2018   Degenerative disc disease, thoracic 05/24/2015    PCP: Berneta Elsie Sayre, MD   REFERRING PROVIDER: Arvell Lye, MD  REFERRING DIAG: [phone] (ICD-10-CM) - Cervical radiculopathy   THERAPY DIAG:  Cervicalgia  Cramp and spasm  Abnormal posture  Rationale for Evaluation and Treatment: Rehabilitation  ONSET DATE: 1 month ago  SUBJECTIVE:                                                                                                                                                                                                          SUBJECTIVE STATEMENT: Neck is better, stretches have really helped, doorway stretch is the best, continued to walk, Christmas stress may have been a factor as well.    Hand dominance: Right  PERTINENT HISTORY:  Tumor resection L SCM (nonmalignant- 2020?)  PAIN:  Are you having pain? Yes: NPRS scale: 3-4/10 Pain location: back of neck into shoulders  Pain description: tight, dull throb  Aggravating factors: sitting, inactivity Relieving factors: moving around, heat  PRECAUTIONS: None  RED FLAGS: None     WEIGHT BEARING RESTRICTIONS: No  FALLS:  Has patient fallen in last 6 months? No  OCCUPATION: VP for sales for company in [place]  PLOF: Independent and Leisure: golf on weekends, walk daily 2 miles/day   PATIENT GOALS: decrease pain  NEXT MD VISIT: none scheduled.   OBJECTIVE:  Note: Objective measures were completed at Evaluation unless otherwise noted.  DIAGNOSTIC FINDINGS:  02/27/2023 IMPRESSION: Moderate degenerative changes of the cervical spine most pronounced at C5-6 and C6-7.  PATIENT SURVEYS:  NDI 14/50  COGNITION: Overall cognitive status: Within functional limits for tasks assessed  SENSATION: WFL  POSTURE: rounded shoulders, forward head, and increased thoracic kyphosis  PALPATION: Tenderness R levator scapulae, tightness bil UT, levators, cervical paraspinals, thoracic and lumbar erector spinae   CERVICAL ROM:   Active ROM A/PROM (deg) eval  Flexion 50  Extension 15  Right lateral flexion 18  Left lateral flexion 22  Right rotation 65  Left rotation 50   (Blank rows = not tested)  UPPER EXTREMITY ROM:  WNL, full pain free and symmetric   UPPER EXTREMITY MMT:  MMT Right eval Left eval  Shoulder flexion 5 5  Shoulder extension    Shoulder abduction 5 5  Shoulder adduction    Shoulder extension    Shoulder internal rotation 5 5  Shoulder external rotation 5 5   Middle trapezius    Lower trapezius    Elbow flexion 5 5  Elbow extension 5 5  Wrist flexion 5 5  Wrist extension 5 5  Wrist ulnar deviation    Wrist radial deviation    Wrist pronation    Wrist supination    Grip strength 80 70   (Blank rows = not tested)  CERVICAL SPECIAL TESTS:  Spurling's test: Negative and Distraction test: Positive  TODAY'S TREATMENT:                                                                                                                              DATE:   04/18/2023  Therapeutic Exercise: to improve strength and mobility.  Demo, verbal and tactile cues throughout for technique. UBE L1 x 6 min (47f/2b) Open booksx 10 r/l On foam noodle Back stroke Open books Cat cows  Child pose Resisted chin tucks RTB Manual Therapy: to decrease muscle spasm and pain and improve mobility In prone - PA mobs to thoracic spine, STM/TPR to thoracic paraspinals, L/S, UT, cervical paraspinals.     03/28/23 EVAL Self Care: Education on findings, POC including TrDN, posture, office posture and set-up, initial HEP (see HEP). Prone on elbows x 1 min with neck extension x 10.   PATIENT EDUCATION:  Education details: HEP update  Person educated: Patient Education method: Explanation, Demonstration, Verbal cues, and Handouts Education comprehension: verbalized understanding and returned demonstration  HOME EXERCISE PROGRAM: Access Code: [email or web address] URL: [email or web address]  Date: 04/18/2023 Prepared by: Almarie Sprinkles  Exercises - Seated Cervical Retraction  - 3 x daily - 7 x weekly - 1 sets - 10 reps - Seated Scapular Retraction  - 3 x daily - 7 x weekly - 1 sets - 10 reps - Seated Shoulder Rolls  - 3 x daily - 7 x weekly - 1 sets - 10 reps - Gentle Levator Scapulae Stretch  - 1 x daily - 7 x weekly - 1 sets - 3 reps - 10-30 sec hold - Seated Gentle Upper Trapezius Stretch  - 1 x daily - 7 x weekly - 1 sets - 3 reps - 10-30 sec hold -  Sternocleidomastoid Stretch  - 1 x daily - 7 x weekly - 1 sets - 3 reps - 10-30 sec hold - Doorway Pec Stretch at 60 Elevation  - 1 x daily - 7 x weekly - 1 sets - 3 reps - 30 sec  hold - Cervical Retraction with Resistance  - 1 x daily - 7 x weekly - 1-2 sets - 10 reps - Cervical Extension Prone on Elbows  - 1 x daily - 7 x weekly - 1-2 sets - 10 reps - Cat Cow to Child's Pose  - 1 x daily - 7 x weekly - 1 sets - 10 reps - Sidelying Open Book  - 1 x daily - 7 x weekly - 1 sets - 10 reps - Thoracic Foam Roll Mobilization Backstroke  - 1 x daily - 7 x weekly - 1 sets - 10 reps - Open Book Chest Stretch on Towel Roll  - 1 x daily - 7 x weekly - 1 sets - 10 reps  Patient Education - Office Posture  ASSESSMENT:  CLINICAL IMPRESSION: Charlie Duos reports significant improvement from IE.  He has been performing HEP consistently which has helped, meeting STG #1.  Today progressed HEP adding more thoracic mobility exercises which were challenging as he is still very stiff and kyphotic through the thoracic spine.  Reported decreased pain after manual therapy.  Shameek Daughtrey continues to demonstrate potential for improvement and would benefit from continued skilled therapy to address impairments.     OBJECTIVE IMPAIRMENTS: decreased activity tolerance, decreased ROM, increased fascial restrictions, impaired perceived functional ability, increased muscle spasms, impaired flexibility, postural dysfunction, and pain.   ACTIVITY LIMITATIONS: carrying, lifting, bending, sitting, and sleeping  PARTICIPATION LIMITATIONS: driving, community activity, and occupation  PERSONAL FACTORS: Time since onset of injury/illness/exacerbation are also affecting patient's functional outcome.   REHAB POTENTIAL: Excellent  CLINICAL DECISION MAKING: Stable/uncomplicated  EVALUATION COMPLEXITY: Low   GOALS: Goals reviewed with patient? Yes  SHORT TERM GOALS: Target date: 04/11/2023   Patient will be independent  with initial HEP.  Baseline: given Goal status: IN PROGRESS  LONG TERM GOALS: Target date: 05/09/2023   Patient will be independent with advanced/ongoing HEP to improve outcomes and carryover.  Baseline:  Goal status: IN PROGRESS  2.  Patient will report 75% improvement in neck pain to improve QOL.  Baseline:  Goal status: IN PROGRESS  3.  Patient will demonstrate 60 deg cervical rotation for safety with driving.  Baseline: see objective Goal status: IN PROGRESS  4.  Patient will report <8/50 on NDI to demonstrate improved functional ability.  Baseline: 14/50 Goal status: IN PROGRESS  5.  Patient will be able to play golf without limitation from neck pain.    Baseline: stopped Goal status: IN PROGRESS   PLAN:  PT FREQUENCY: 1-2x/week  PT  DURATION: 6 [place]  PLANNED INTERVENTIONS: 97110-Therapeutic exercises, 97530- Therapeutic activity, [id number]- Neuromuscular re-education, 97535- Self Care, [id number]- Manual therapy, [id number]- Electrical stimulation (unattended), [id number]- Electrical stimulation (manual), [id number]- Ultrasound, 02987- Traction (mechanical), Patient/Family education, Taping, Dry Needling, Joint mobilization, Joint manipulation, Spinal manipulation, Spinal mobilization, Cryotherapy, and Moist heat  PLAN FOR NEXT SESSION: continue to progress thoracic mobility, manual therapy, modalities PRN   Almarie JINNY Sprinkles, PT, DPT  04/18/2023, 12:45 PM

## 2023-04-25 ENCOUNTER — Encounter: Payer: BC Managed Care – PPO | Admitting: Physical Therapy

## 2023-05-02 ENCOUNTER — Encounter: Payer: BC Managed Care – PPO | Admitting: Physical Therapy

## 2023-05-09 ENCOUNTER — Encounter: Payer: BC Managed Care – PPO | Admitting: Physical Therapy

## 2023-06-13 ENCOUNTER — Emergency Department (HOSPITAL_COMMUNITY)

## 2023-06-13 ENCOUNTER — Ambulatory Visit

## 2023-06-13 ENCOUNTER — Telehealth: Payer: Self-pay | Admitting: Internal Medicine

## 2023-06-13 ENCOUNTER — Telehealth

## 2023-06-13 ENCOUNTER — Encounter (HOSPITAL_COMMUNITY): Payer: Self-pay

## 2023-06-13 ENCOUNTER — Observation Stay (HOSPITAL_COMMUNITY)
Admission: EM | Admit: 2023-06-13 | Discharge: 2023-06-14 | Disposition: A | Discharge status: Home / Self Care | Attending: Student | Admitting: Student

## 2023-06-13 ENCOUNTER — Other Ambulatory Visit: Payer: Self-pay

## 2023-06-13 DIAGNOSIS — M109 Gout, unspecified: Secondary | ICD-10-CM | POA: Diagnosis not present

## 2023-06-13 DIAGNOSIS — Z79899 Other long term (current) drug therapy: Secondary | ICD-10-CM | POA: Insufficient documentation

## 2023-06-13 DIAGNOSIS — R030 Elevated blood-pressure reading, without diagnosis of hypertension: Secondary | ICD-10-CM | POA: Insufficient documentation

## 2023-06-13 DIAGNOSIS — I63512 Cerebral infarction due to unspecified occlusion or stenosis of left middle cerebral artery: Secondary | ICD-10-CM | POA: Diagnosis not present

## 2023-06-13 DIAGNOSIS — Z87891 Personal history of nicotine dependence: Secondary | ICD-10-CM | POA: Insufficient documentation

## 2023-06-13 DIAGNOSIS — E1165 Type 2 diabetes mellitus with hyperglycemia: Secondary | ICD-10-CM | POA: Diagnosis not present

## 2023-06-13 DIAGNOSIS — E119 Type 2 diabetes mellitus without complications: Secondary | ICD-10-CM

## 2023-06-13 DIAGNOSIS — E785 Hyperlipidemia, unspecified: Secondary | ICD-10-CM | POA: Diagnosis not present

## 2023-06-13 DIAGNOSIS — E876 Hypokalemia: Secondary | ICD-10-CM | POA: Insufficient documentation

## 2023-06-13 DIAGNOSIS — F1290 Cannabis use, unspecified, uncomplicated: Secondary | ICD-10-CM | POA: Insufficient documentation

## 2023-06-13 DIAGNOSIS — Z794 Long term (current) use of insulin: Secondary | ICD-10-CM | POA: Diagnosis not present

## 2023-06-13 DIAGNOSIS — I639 Cerebral infarction, unspecified: Secondary | ICD-10-CM | POA: Diagnosis present

## 2023-06-13 DIAGNOSIS — R202 Paresthesia of skin: Secondary | ICD-10-CM | POA: Diagnosis present

## 2023-06-13 DIAGNOSIS — I6381 Other cerebral infarction due to occlusion or stenosis of small artery: Principal | ICD-10-CM

## 2023-06-13 DIAGNOSIS — F1090 Alcohol use, unspecified, uncomplicated: Secondary | ICD-10-CM | POA: Diagnosis not present

## 2023-06-13 LAB — CBG MONITORING, ED: Glucose-Capillary: 136 mg/dL — ABNORMAL HIGH (ref 70–99)

## 2023-06-13 LAB — RAPID URINE DRUG SCREEN, HOSP PERFORMED
Amphetamines: NOT DETECTED
Barbiturates: NOT DETECTED
Benzodiazepines: NOT DETECTED
Cocaine: NOT DETECTED
Opiates: NOT DETECTED
Tetrahydrocannabinol: POSITIVE — AB

## 2023-06-13 LAB — URINALYSIS, ROUTINE W REFLEX MICROSCOPIC
Bilirubin Urine: NEGATIVE
Glucose, UA: NEGATIVE mg/dL
Hgb urine dipstick: NEGATIVE
Ketones, ur: 5 mg/dL — AB
Leukocytes,Ua: NEGATIVE
Nitrite: NEGATIVE
Protein, ur: NEGATIVE mg/dL
Specific Gravity, Urine: 1.014 (ref 1.005–1.030)
pH: 5 (ref 5.0–8.0)

## 2023-06-13 LAB — CBC
HCT: 47.7 % (ref 39.0–52.0)
Hemoglobin: 16.3 g/dL (ref 13.0–17.0)
MCH: 33.2 pg (ref 26.0–34.0)
MCHC: 34.2 g/dL (ref 30.0–36.0)
MCV: 97.1 fL (ref 80.0–100.0)
Platelets: 280 10*3/uL (ref 150–400)
RBC: 4.91 MIL/uL (ref 4.22–5.81)
RDW: 12.7 % (ref 11.5–15.5)
WBC: 10.4 10*3/uL (ref 4.0–10.5)
nRBC: 0 % (ref 0.0–0.2)

## 2023-06-13 LAB — COMPREHENSIVE METABOLIC PANEL
ALT: 39 U/L (ref 0–44)
AST: 25 U/L (ref 15–41)
Albumin: 4.1 g/dL (ref 3.5–5.0)
Alkaline Phosphatase: 81 U/L (ref 38–126)
Anion gap: 11 (ref 5–15)
BUN: 13 mg/dL (ref 6–20)
CO2: 21 mmol/L — ABNORMAL LOW (ref 22–32)
Calcium: 10 mg/dL (ref 8.9–10.3)
Chloride: 108 mmol/L (ref 98–111)
Creatinine, Ser: 0.91 mg/dL (ref 0.61–1.24)
GFR, Estimated: >60 mL/min (ref >60)
Glucose, Bld: 111 mg/dL — ABNORMAL HIGH (ref 70–99)
Potassium: 3.6 mmol/L (ref 3.5–5.1)
Sodium: 140 mmol/L (ref 135–145)
Total Bilirubin: 0.7 mg/dL (ref 0.0–1.2)
Total Protein: 7.6 g/dL (ref 6.5–8.1)

## 2023-06-13 LAB — I-STAT CHEM 8, ED
BUN: 12 mg/dL (ref 6–20)
Calcium, Ion: 1.22 mmol/L (ref 1.15–1.40)
Chloride: 109 mmol/L (ref 98–111)
Creatinine, Ser: 0.9 mg/dL (ref 0.61–1.24)
Glucose, Bld: 104 mg/dL — ABNORMAL HIGH (ref 70–99)
HCT: 48 % (ref 39.0–52.0)
Hemoglobin: 16.3 g/dL (ref 13.0–17.0)
Potassium: 3.6 mmol/L (ref 3.5–5.1)
Sodium: 142 mmol/L (ref 135–145)
TCO2: 20 mmol/L — ABNORMAL LOW (ref 22–32)

## 2023-06-13 LAB — PROTIME-INR
INR: 1 (ref 0.8–1.2)
Prothrombin Time: 13.3 s (ref 11.4–15.2)

## 2023-06-13 LAB — DIFFERENTIAL
Abs Immature Granulocytes: 0.06 10*3/uL (ref 0.00–0.07)
Basophils Absolute: 0.1 10*3/uL (ref 0.0–0.1)
Basophils Relative: 1 %
Eosinophils Absolute: 0.2 10*3/uL (ref 0.0–0.5)
Eosinophils Relative: 2 %
Immature Granulocytes: 1 %
Lymphocytes Relative: 22 %
Lymphs Abs: 2.3 10*3/uL (ref 0.7–4.0)
Monocytes Absolute: 0.9 10*3/uL (ref 0.1–1.0)
Monocytes Relative: 9 %
Neutro Abs: 6.9 10*3/uL (ref 1.7–7.7)
Neutrophils Relative %: 65 %

## 2023-06-13 LAB — ETHANOL: Alcohol, Ethyl (B): <10 mg/dL (ref <10)

## 2023-06-13 LAB — APTT: aPTT: 29 s (ref 24–36)

## 2023-06-13 MED ORDER — ASPIRIN 325 MG PO TABS
650.0000 mg | ORAL_TABLET | Freq: Once | ORAL | Status: AC
Start: 2023-06-13 — End: 2023-06-13
  Administered 2023-06-13: 650 mg via ORAL
  Filled 2023-06-13: qty 2

## 2023-06-13 NOTE — Telephone Encounter (Signed)
 Patient scheduled an appointment for regular urgent care at 1:45 PM with chief complaint of dizziness and right sided numbness. I was able to call patient to discuss concerns of acute stroke given symptoms.  Offered for him to come to [facility] for assessment, however given his strokelike symptoms I would end up sending him to [facility] emergency department despite assessment at urgent care given clinical concern for acute CVA.   States he lost his balance yesterday afternoon couple of times but did not actually fall down.  Overnight last night around 1 AM or 2 AM, he woke up with right arm numbness and right side numbness along the lateral right abdomen.  He states he feels as though these areas of his body are "asleep".  This has happened in the past when he has slept wrong on his right arm but this time the numbness and tingling is not going away and has persisted into this morning/this afternoon.  He additionally reports right sided extremity weakness compared to the left side.  Reports normal gait without leg weakness, normal vision, and normal speech.  His speech does not sound slurred on the phone. Denies headache, recent falls/injuries to the head, back pain, and nausea/vomiting.   He is a type II diabetic and reports he had eaten breakfast this morning, symptoms have persisted despite eating.   He has never had a stroke in the past and denies known family history of CVA. No chest pain or heart palpitations reported.  He does not take blood thinners. He has not checked his blood sugar in "a long time".   Patient is currently at work, however I have advised him to call EMS and go to the nearest emergency room for further workup and evaluation due to concern for acute CVA. Patient states he would rather have a coworker drive him. I discussed the importance of medical monitoring and route to the hospital and would prefer for him to go by EMS. Patient expresses understanding and  agreement with plan, plans on proceeding to the nearest emergency room immediately.

## 2023-06-13 NOTE — ED Provider Notes (Signed)
 [place] EMERGENCY DEPARTMENT AT [facility] Provider Note   CSN: [id number] Arrival date & time: 06/13/23  1235     History  Chief Complaint  Patient presents with   arm numbness    right    Joshua Morse is a 58 y.o. male.  Patient with history of diabetes presents today with complaints of right arm numbness. Does note that he felt intermittently dizzy yesterday, describes this as "feeling like the ground wasn't level." Denies lightheadedness, diplopia, or room spinning sensation. No recent trauma. He states that he went to sleep last night around 9:30 PM in good health and woke up around 3 AM to go to the bathroom and noticed his right arm felt weak and numb. He states that he thought he had likely slept on it and therefore did not think much of it and went back to sleep. Woke up again at 6 AM and symptoms were still present. Notes that his numbness extends to his right chest and abdomen and to his right hip, down his right arm, and into the right side of his face. His arm and face feel weak on the right side as well. No right leg involvement. He no longer feels dizzy. He was able to walk to the room without any difficulty. He does not a slight frontal headache that has been present all day as well. Denies vision changes. No history of similar symptoms previously. Called his pcp who recommended he come here for stroke work-up. Presents for same.   The history is provided by the patient. No language interpreter was used.       Home Medications Prior to Admission medications   Medication Sig Start Date End Date Taking? Authorizing Provider  atorvastatin (LIPITOR) 10 MG tablet Take 1 tablet (10 mg total) by mouth daily. 02/12/23   Mliss Sax, MD  colchicine 0.6 MG tablet Take 1.2mg  initially followed by 0.6mg  1 hour later.  Continue 0.6mg  daily until resolution 11/25/21   Mliss Sax, MD  indomethacin (INDOCIN) 50 MG capsule TAKE 1 CAPSULE BY MOUTH THREE  TIMES DAILY AS NEEDED FOR GOUT ATTACKS 11/08/22   Mliss Sax, MD  metFORMIN (GLUCOPHAGE-XR) 500 MG 24 hr tablet TAKE 1 TABLET(500 MG) BY MOUTH TWICE DAILY WITH A MEAL 02/15/23   Mliss Sax, MD  triamcinolone cream (KENALOG) 0.1 % Applied to rash on legs twice daily as needed. Patient not taking: Reported on 01/17/2023 06/08/21   Mliss Sax, MD      Allergies    Patient has no known allergies.    Review of Systems   Review of Systems  Neurological:  Positive for weakness, numbness and headaches.  All other systems reviewed and are negative.   Physical Exam Updated Vital Signs BP (!) 139/104   Pulse 77   Temp 97.9 F (36.6 C)   Resp 14   Ht 6' (1.829 m)   Wt 97.5 kg   SpO2 99%   BMI 29.16 kg/m  Physical Exam Vitals and nursing note reviewed.  Constitutional:      General: He is not in acute distress.    Appearance: Normal appearance. He is normal weight. He is not ill-appearing, toxic-appearing or diaphoretic.  HENT:     Head: Normocephalic and atraumatic.  Cardiovascular:     Rate and Rhythm: Normal rate.  Pulmonary:     Effort: Pulmonary effort is normal. No respiratory distress.  Musculoskeletal:        General: Normal range  of motion.     Cervical back: Normal range of motion.  Skin:    General: Skin is warm and dry.  Neurological:     General: No focal deficit present.     Mental Status: He is alert and oriented to person, place, and time.     GCS: GCS eye subscore is 4. GCS verbal subscore is 5. GCS motor subscore is 6.     Sensory: Sensation is intact.     Motor: Motor function is intact.     Coordination: Coordination is intact.     Gait: Gait is intact.     Comments: Alert and oriented to self, place, time and event.    Speech is fluent, clear without dysarthria or dysphasia.   Strength 5/5 in bilateral upper and lower extremities. Patient notes that he feels like he is weaker in the right arm, however not appreciated on  exam.   Patient with numbness to the right side of the face, down the right neck, right arm, right chest, and right abdomen ending at the right hip. No numbness to the right leg. Normal sensation to the left side   CN I not tested  CN II grossly intact visual fields bilaterally. Did not visualize posterior eye.  CN III, IV, VI PERRLA and EOMs intact bilaterally  CN V Intact sensation to sharp and light touch to the face  CN VII facial movements symmetric, patient odes note subjective muscle weakness to the right side of the face, not appreciable on exam CN VIII not tested  CN IX, X no uvula deviation, symmetric rise of soft palate  CN XI 5/5 SCM and trapezius strength bilaterally  CN XII Midline tongue protrusion, symmetric L/R movements   Psychiatric:        Mood and Affect: Mood normal.        Behavior: Behavior normal.     ED Results / Procedures / Treatments   Labs (all labs ordered are listed, but only abnormal results are displayed) Labs Reviewed  COMPREHENSIVE METABOLIC PANEL - Abnormal; Notable for the following components:      Result Value   CO2 21 (*)    Glucose, Bld 111 (*)    All other components within normal limits  CBG MONITORING, ED - Abnormal; Notable for the following components:   Glucose-Capillary 136 (*)    All other components within normal limits  I-STAT CHEM 8, ED - Abnormal; Notable for the following components:   Glucose, Bld 104 (*)    TCO2 20 (*)    All other components within normal limits  ETHANOL  PROTIME-INR  APTT  CBC  DIFFERENTIAL  RAPID URINE DRUG SCREEN, HOSP PERFORMED  URINALYSIS, ROUTINE W REFLEX MICROSCOPIC    EKG None  Radiology MR BRAIN WO CONTRAST Result Date: 06/13/2023 CLINICAL DATA:  Neuro deficit, acute, stroke suspected EXAM: MRI HEAD WITHOUT CONTRAST TECHNIQUE: Multiplanar, multiecho pulse sequences of the brain and surrounding structures were obtained without intravenous contrast. COMPARISON:  CT head from today.  FINDINGS: Brain: Acute left thalamocapsular infarct. Slight edema without mass effect. No midline shift. No hydrocephalus. No mass lesion. Moderate patchy T2/FLAIR hyperintensities in the white matter are nonspecific but compatible with chronic microvascular ischemic disease. Vascular: Major arterial flow voids are maintained at the skull base. Skull and upper cervical spine: Normal marrow signal. Sinuses/Orbits: Right maxillary sinus retention cyst. Mostly clear sinuses. No acute orbital findings. Other: No mastoid effusions. IMPRESSION: Acute left thalamocapsular infarct. Electronically Signed   By: Gelene Mink  Barnett Applebaum M.D.   On: 06/13/2023 20:59   CT HEAD WO CONTRAST Result Date: 06/13/2023 CLINICAL DATA:  Right-sided numbness beginning at 3 a.m. EXAM: CT HEAD WITHOUT CONTRAST TECHNIQUE: Contiguous axial images were obtained from the base of the skull through the vertex without intravenous contrast. RADIATION DOSE REDUCTION: This exam was performed according to the departmental dose-optimization program which includes automated exposure control, adjustment of the mA and/or kV according to patient size and/or use of iterative reconstruction technique. COMPARISON:  None Available. FINDINGS: Brain: Scattered subcortical white matter hypoattenuation is present bilaterally, mildly advanced for age. No acute infarct, hemorrhage, or mass lesion is present. Deep brain nuclei are within normal limits. The ventricles are of normal size. No significant extraaxial fluid collection is present. The brainstem and cerebellum are within normal limits. Midline structures are within normal limits. Vascular: No hyperdense vessel or unexpected calcification. Skull: Calvarium is intact. No focal lytic or blastic lesions are present. Sinuses/Orbits: There is a polyp retention cyst is present in the right maxillary sinus. The paranasal sinuses and mastoid air cells are otherwise clear. IMPRESSION: 1. No acute intracranial abnormality or  significant interval change. 2. Scattered subcortical white matter hypoattenuation bilaterally is mildly advanced for age. This likely reflects the sequela of chronic microvascular ischemia. Electronically Signed   By: Marin Roberts M.D.   On: 06/13/2023 13:30    Procedures Procedures    Medications Ordered in ED Medications - No data to display  ED Course/ Medical Decision Making/ A&P                                 Medical Decision Making Amount and/or Complexity of Data Reviewed Radiology: ordered.  Risk OTC drugs.   This patient is a 58 y.o. male who presents to the ED for concern of right arm numbness, this involves an extensive number of treatment options, and is a complaint that carries with it a high risk of complications and morbidity. The emergent differential diagnosis prior to evaluation includes, but is not limited to,  CVA, spinal cord injury, ACS, arrhythmia, syncope, orthostatic hypotension, sepsis, hypoglycemia, hypoxia, electrolyte disturbance, endocrine disorder, anemia, environmental exposure, polypharmacy   This is not an exhaustive differential.   Past Medical History / Co-morbidities / Social History:  has a past medical history of Arthritis, Diabetes mellitus without complication (HCC), Gout, and Mass of left parotid gland.  Additional history: Chart reviewed. Pertinent results include: did video visit with pcp earlier today, recommended to come here for CVA rule out  Physical Exam: Physical exam performed. The pertinent findings include: see above for detailed exam. Numbness from right forehead down to right hip including right arm. No other deficits.  Lab Tests: I ordered, and personally interpreted labs.  The pertinent results include:  UDS THC +. No other acute laboratory abnormalities   Imaging Studies: I ordered imaging studies including CT head, MRI brain. I independently visualized and interpreted imaging which showed   CT:   1. No  acute intracranial abnormality or significant interval change. 2. Scattered subcortical white matter hypoattenuation bilaterally is mildly advanced for age. This likely reflects the sequela of chronic microvascular ischemia.  MRI  Acute left thalamocapsular infarct.   I agree with the radiologist interpretation.   Cardiac Monitoring:  The patient was maintained on a cardiac monitor.  My attending physician Dr. Jeraldine Loots viewed and interpreted the cardiac monitored which showed an underlying rhythm of: sinus rhythm,  no STEMI. I agree with this interpretation.  Consultations Obtained: I requested consultation with the neurology on call Dr. Otelia Limes,  and discussed lab and imaging findings as well as pertinent plan - they recommend: give 650 mg of aspirin, they will see the patient   Disposition: After consideration of the diagnostic results and the patients response to treatment, I feel that patient will require admission for management of acute left thalamic capsular infarct.  Discussed same with patient is understanding and agreement with this.    I discussed this case with my attending physician Dr. Jeraldine Loots who cosigned this note including patient's presenting symptoms, physical exam, and planned diagnostics and interventions. Attending physician stated agreement with plan or made changes to plan which were implemented.    Final Clinical Impression(s) / ED Diagnoses Final diagnoses:  Left thalamic infarction [facility])    Rx / DC Orders ED Discharge Orders     None         Vear Clock 06/13/23 2212    Gerhard Munch, MD 06/14/23 2122

## 2023-06-13 NOTE — Consult Note (Signed)
 NEUROLOGY CONSULT NOTE   Date of service: June 13, 2023 Patient Name: Joshua Morse MRN:  [id number] DOB:  [birth year] Chief Complaint: Right arm weakness and paresthesias Requesting Provider: Gerhard Munch, MD  History of Present Illness  Joshua Morse is a 58 y.o. male with a PMHx of pre-diabetes (managed with metformin, diet and exercise), arthritis, gout, and left parotid mass s/p resection who presented to the ED on Wednesday afternoon with new onset of RUE numbness that was first noticed on awakening at 3 AM. He initially thought the numbness was due to "sleeping on my arm" and that it would go away on its own. When he woke up again at 6 AM, the numbness was still there. When he got up, there was some unsteadiness due to his RLE not moving correctly. He also had some incoordination of his RUE and some mild face paresthesias. He denies any sensation of leg numbness while at home. He also denies any vision loss, dysarthria, facial droop, aphasia or left sided weakness.   MRI revealed an acute left thalamic lacunar infarction.   Home medications include low-dose atorvastatin.     ROS  Comprehensive ROS performed and pertinent positives documented in HPI    Past History   Past Medical History:  Diagnosis Date   Arthritis    Diabetes mellitus without complication (HCC)    Pre-diabetic   Gout    Mass of left parotid gland     Past Surgical History:  Procedure Laterality Date   PAROTIDECTOMY Left 12/09/2019   Procedure: LEFT TOTALPAROTIDECTOMY;  Surgeon: Newman Pies, MD;  Location: [facility];  Service: ENT;  Laterality: Left;  REQUESTING  RNFA FOR START   TONSILLECTOMY      Family History: Family History  Problem Relation Age of Onset   Healthy Mother    Gout Maternal Grandmother    Gout Paternal Grandfather    Colon cancer Neg Hx    Colon polyps Neg Hx    Esophageal cancer Neg Hx    Rectal cancer Neg Hx    Stomach cancer Neg Hx     Social History   reports that he quit smoking about 26 years ago. His smoking use included cigarettes. He has never used smokeless tobacco. He reports current alcohol use. He reports that he does not use drugs.  No Known Allergies  Medications   Current Facility-Administered Medications:    aspirin tablet 650 mg, 650 mg, Oral, Once, Smoot, Sarah A, PA-C  Current Outpatient Medications:    atorvastatin (LIPITOR) 10 MG tablet, Take 1 tablet (10 mg total) by mouth daily., Disp: 90 tablet, Rfl: 3   colchicine 0.6 MG tablet, Take 1.2mg  initially followed by 0.6mg  1 hour later.  Continue 0.6mg  daily until resolution, Disp: 30 tablet, Rfl: 2   indomethacin (INDOCIN) 50 MG capsule, TAKE 1 CAPSULE BY MOUTH THREE TIMES DAILY AS NEEDED FOR GOUT ATTACKS, Disp: 30 capsule, Rfl: 0   metFORMIN (GLUCOPHAGE-XR) 500 MG 24 hr tablet, TAKE 1 TABLET(500 MG) BY MOUTH TWICE DAILY WITH A MEAL, Disp: 180 tablet, Rfl: 1   triamcinolone cream (KENALOG) 0.1 %, Applied to rash on legs twice daily as needed. (Patient not taking: Reported on 01/17/2023), Disp: 80 g, Rfl: 0  Vitals   Vitals:   06/13/23 1830 06/13/23 1915 06/13/23 2045 06/13/23 2058  BP: (!) 148/97 (!) 139/104 (!) 144/86   Pulse: 73 77 71   Resp: 11 14 (!) 21   Temp:    99.2 F (37.3 C)  TempSrc:    Oral  SpO2: 100% 99% 98%   Weight:      Height:        Body mass index is 29.16 kg/m.  Physical Exam   Physical Exam  HEENT-  [name]/AT    Lungs- Respirations unlabored Extremities- No edema  Neurological Examination Mental Status: Alert, oriented x 5, thought content appropriate.  Speech fluent without evidence of aphasia.  Able to follow all commands without difficulty. Cranial Nerves: II: Temporal visual fields intact with no extinction to DSS. PERRL  III,IV, VI: No ptosis. EOMI.  V: Temp and FT sensation equal bilaterally  VII: Smile symmetric VIII: Hearing intact to voice IX,X: No hoarseness XI: Symmetric shoulder shrug XII: Midline tongue  extension Motor: BUE 5/5 proximally and distally BLE 5/5 proximally and distally  Subtle right pronator drift.  Sensory: Temp and light touch intact to LUE and LLE. RUE with dysesthesia (tingling) to FT along entire arm. RLE also with dysesthesia to FT. Temp sensation mildly decreased to RUE and RLE. No extinction to DSS.  Deep Tendon Reflexes: 2+ and symmetric throughout Cerebellar: No ataxia with FNF or H-S bilaterally. Mild dysdiadochokinesia with RAM bilaterally.  Gait: Deferred  Labs/Imaging/Neurodiagnostic studies   CBC:  Recent Labs  Lab 08-Jul-2023 1254 07-08-2023 1317  WBC 10.4  --   NEUTROABS 6.9  --   HGB 16.3 16.3  HCT 47.7 48.0  MCV 97.1  --   PLT 280  --    Basic Metabolic Panel:  Lab Results  Component Value Date   NA 142 07/08/23   K 3.6 2023/07/08   CO2 21 (L) 2023-07-08   GLUCOSE 104 (H) 2023-07-08   BUN 12 08-Jul-2023   CREATININE 0.90 07-08-2023   CALCIUM 10.0 July 08, 2023   GFRNONAA >60 2023-07-08   Lipid Panel:  Lab Results  Component Value Date   LDLCALC 92 08/16/2022   HgbA1c:  Lab Results  Component Value Date   HGBA1C 6.6 (H) 02/12/2023   Urine Drug Screen:     Component Value Date/Time   LABOPIA NONE DETECTED 2023-07-08 2036   COCAINSCRNUR NONE DETECTED 2023/07/08 2036   LABBENZ NONE DETECTED July 08, 2023 2036   AMPHETMU NONE DETECTED July 08, 2023 2036   THCU POSITIVE (A) 07-08-23 2036   LABBARB NONE DETECTED Jul 08, 2023 2036    Alcohol Level     Component Value Date/Time   ETH <10 07-08-23 1254   INR  Lab Results  Component Value Date   INR 1.0 07-08-23   APTT  Lab Results  Component Value Date   APTT 29 Jul 08, 2023     ASSESSMENT  58 year old male with a PMHx of pre-diabetes (managed with metformin, diet and exercise), arthritis, gout, and left parotid mass s/p resection who presented to the ED on Wednesday afternoon with new onset of RUE numbness that was first noticed on awakening at 3 AM. He initially thought the  numbness was due to "sleeping on my arm" and that it would go away on its own. When he woke up again at 6 AM, the numbness was still there. When he got up, there was some unsteadiness due to his RLE not moving correctly. He also had some incoordination of his RUE and some mild face paresthesias. He denies any sensation of leg numbness while at home. He also denies any vision loss, dysarthria, facial droop, aphasia or left sided weakness. MRI revealed an acute left thalamic lacunar infarction. Home medications include low-dose atorvastatin. - Exam reveals subtle RUE drift but otherwise  with 5/5 strength throughout. Dysesthesias to FT stimulation on the right. Also with mildly decreased right sided temperature sensation. Mild dysdiadochokinesia with RAM bilaterally.  - MRI brain: Acute left thalamic infarct. Moderate patchy T2/FLAIR hyperintensities in the white matter are nonspecific but compatible with chronic microvascular ischemic disease. - CTA of head and neck: No emergent large vessel occlusion or proximal hemodynamically significant stenosis. - EKG: Normal sinus rhythm; cannot rule out Anterior infarct, age undetermined - Impression: Acute left thalamic infarction, most likely secondary to small vessel disease in the context of his prediabetes. Also may have undiagnosed HTN.   RECOMMENDATIONS  - Agree with starting ASA. Add Plavix 75 mg po every day and continue DAPT x 21 days, then ASA alone thereafter.  - Increase atorvastatin to 40 mg po every day.  - HgbA1c, fasting lipid panel - PT consult, OT consult, Speech consult - Echocardiogram - BP management per standard protocol with goal of 120/80. Out of the permissive HTN time window.  - Risk factor modification - Telemetry monitoring - Frequent neuro checks - NPO until passes stroke swallow screen  ______________________________________________________________________    Dessa Phi, Latanya Hemmer, MD [facility]

## 2023-06-13 NOTE — ED Notes (Signed)
 Patient returned from MRI.

## 2023-06-13 NOTE — ED Notes (Signed)
 Pt provided with a urinal and encouraged to provide a urine sample.

## 2023-06-13 NOTE — ED Notes (Signed)
 Patient transported to MRI

## 2023-06-13 NOTE — ED Provider Triage Note (Signed)
 Emergency Medicine Provider Triage Evaluation Note  Joshua Morse , a 58 y.o. male  was evaluated in triage.  Pt complains of right arm numbness and weakness.  States he went to bed at 10 PM feeling normal.  He woke up at 3 AM with numbness to the right arm.  Felt like he slept on it wrong.  States symptoms have persisted the entire day and have progressed down to his right leg.  He feels dizzy when he is walking as well.  Review of Systems  Positive: As above Negative: As above  Physical Exam  BP (!) 154/96 (BP Location: Right Arm)   Pulse 77   Temp 98.2 F (36.8 C) (Oral)   Resp 18   Ht 6' (1.829 m)   Wt 97.5 kg   SpO2 99%   BMI 29.16 kg/m  Gen:   Awake, no distress   Resp:  Normal effort  MSK:   Moves extremities without difficulty  Other:    MENTAL STATUS: AAOx3   LANG/SPEECH: Fluent, intact naming, repetition & comprehension   CRANIAL NERVES:   II: Pupils equal and reactive   III, IV, VI: EOM intact, no gaze preference or deviation, no nystagmus   V: normal sensation of the face   VII: no facial asymmetry   VIII: normal hearing to speech   MOTOR: 5/5 in both upper and lower extremities   SENSORY: Normal to touch in all extremiteis   COORD: Normal finger to nose, heel to shin and shoulder shrug, no tremor, no dysmetria.  Slight right sided pronator drift   Medical Decision Making  Medically screening exam initiated at 12:54 PM.  Appropriate orders placed.  Joshua Morse was informed that the remainder of the evaluation will be completed by another provider, this initial triage assessment does not replace that evaluation, and the importance of remaining in the ED until their evaluation is complete.  Workup initiated   Joshua Morse, Joshua Morse 06/13/23 1255

## 2023-06-13 NOTE — ED Triage Notes (Signed)
 Pt to ED via POV c/o right arm numbness, right side numbness that started at 3am, reports noticed it when woke up at 3am, went back to sleep, woke up at 6am and symptoms still there. Reports was told to come to ED from a virtual visit. Pt A&O X 4 in triage.

## 2023-06-14 ENCOUNTER — Inpatient Hospital Stay (HOSPITAL_COMMUNITY)

## 2023-06-14 ENCOUNTER — Other Ambulatory Visit: Payer: Self-pay | Admitting: Neurology

## 2023-06-14 DIAGNOSIS — E119 Type 2 diabetes mellitus without complications: Secondary | ICD-10-CM

## 2023-06-14 DIAGNOSIS — E785 Hyperlipidemia, unspecified: Secondary | ICD-10-CM | POA: Diagnosis not present

## 2023-06-14 DIAGNOSIS — I639 Cerebral infarction, unspecified: Secondary | ICD-10-CM | POA: Diagnosis present

## 2023-06-14 DIAGNOSIS — E1165 Type 2 diabetes mellitus with hyperglycemia: Secondary | ICD-10-CM | POA: Diagnosis not present

## 2023-06-14 DIAGNOSIS — F1290 Cannabis use, unspecified, uncomplicated: Secondary | ICD-10-CM

## 2023-06-14 DIAGNOSIS — I6389 Other cerebral infarction: Secondary | ICD-10-CM | POA: Diagnosis not present

## 2023-06-14 DIAGNOSIS — R030 Elevated blood-pressure reading, without diagnosis of hypertension: Secondary | ICD-10-CM | POA: Insufficient documentation

## 2023-06-14 LAB — LIPID PANEL
Cholesterol: 130 mg/dL (ref 0–200)
HDL: 34 mg/dL — ABNORMAL LOW (ref >40)
LDL Cholesterol: 66 mg/dL (ref 0–99)
Total CHOL/HDL Ratio: 3.8 ratio
Triglycerides: 148 mg/dL (ref <150)
VLDL: 30 mg/dL (ref 0–40)

## 2023-06-14 LAB — ECHOCARDIOGRAM COMPLETE BUBBLE STUDY
Area-P 1/2: 2.33 cm2
S' Lateral: 3.8 cm

## 2023-06-14 LAB — COMPREHENSIVE METABOLIC PANEL
ALT: 34 U/L (ref 0–44)
AST: 25 U/L (ref 15–41)
Albumin: 3.5 g/dL (ref 3.5–5.0)
Alkaline Phosphatase: 69 U/L (ref 38–126)
Anion gap: 9 (ref 5–15)
BUN: 10 mg/dL (ref 6–20)
CO2: 21 mmol/L — ABNORMAL LOW (ref 22–32)
Calcium: 9.2 mg/dL (ref 8.9–10.3)
Chloride: 109 mmol/L (ref 98–111)
Creatinine, Ser: 0.82 mg/dL (ref 0.61–1.24)
GFR, Estimated: >60 mL/min (ref >60)
Glucose, Bld: 116 mg/dL — ABNORMAL HIGH (ref 70–99)
Potassium: 3.3 mmol/L — ABNORMAL LOW (ref 3.5–5.1)
Sodium: 139 mmol/L (ref 135–145)
Total Bilirubin: 0.8 mg/dL (ref 0.0–1.2)
Total Protein: 6.6 g/dL (ref 6.5–8.1)

## 2023-06-14 LAB — HEMOGLOBIN A1C
Hgb A1c MFr Bld: 6.7 % — ABNORMAL HIGH (ref 4.8–5.6)
Mean Plasma Glucose: 145.59 mg/dL

## 2023-06-14 LAB — MAGNESIUM: Magnesium: 2.2 mg/dL (ref 1.7–2.4)

## 2023-06-14 LAB — CBG MONITORING, ED
Glucose-Capillary: 232 mg/dL — ABNORMAL HIGH (ref 70–99)
Glucose-Capillary: 116 mg/dL — ABNORMAL HIGH (ref 70–99)
Glucose-Capillary: 109 mg/dL — ABNORMAL HIGH (ref 70–99)
Glucose-Capillary: 99 mg/dL (ref 70–99)

## 2023-06-14 MED ORDER — ACETAMINOPHEN 160 MG/5ML PO SOLN: 650.0000 mg | ORAL | Status: DC | PRN

## 2023-06-14 MED ORDER — ENOXAPARIN SODIUM 40 MG/0.4ML IJ SOSY
40.0000 mg | PREFILLED_SYRINGE | INTRAMUSCULAR | Status: DC
  Administered 2023-06-14: 40 mg via SUBCUTANEOUS
  Filled 2023-06-14: qty 0.4

## 2023-06-14 MED ORDER — ATORVASTATIN CALCIUM 10 MG PO TABS
10.0000 mg | ORAL_TABLET | Freq: Every day | ORAL | Status: DC
  Administered 2023-06-14: 10 mg via ORAL
  Filled 2023-06-14: qty 1

## 2023-06-14 MED ORDER — IOHEXOL 350 MG/ML SOLN
75.0000 mL | Freq: Once | INTRAVENOUS | Status: AC | PRN
  Administered 2023-06-14: 75 mL via INTRAVENOUS

## 2023-06-14 MED ORDER — INSULIN ASPART 100 UNIT/ML IJ SOLN
0.0000 [IU] | INTRAMUSCULAR | Status: DC
  Administered 2023-06-14: 5 [IU] via SUBCUTANEOUS

## 2023-06-14 MED ORDER — ASPIRIN 81 MG PO TBEC
81.0000 mg | DELAYED_RELEASE_TABLET | Freq: Every day | ORAL | Status: DC
  Administered 2023-06-14: 81 mg via ORAL
  Filled 2023-06-14: qty 1

## 2023-06-14 MED ORDER — STROKE: EARLY STAGES OF RECOVERY BOOK: Freq: Once | Status: DC

## 2023-06-14 MED ORDER — ACETAMINOPHEN 650 MG RE SUPP: 650.0000 mg | RECTAL | Status: DC | PRN

## 2023-06-14 MED ORDER — CLOPIDOGREL BISULFATE 75 MG PO TABS: 75.0000 mg | ORAL_TABLET | Freq: Every day | ORAL | 0 refills | Status: AC

## 2023-06-14 MED ORDER — PERFLUTREN LIPID MICROSPHERE
1.0000 mL | INTRAVENOUS | Status: AC | PRN
  Administered 2023-06-14: 2 mL via INTRAVENOUS

## 2023-06-14 MED ORDER — SODIUM CHLORIDE 0.9 % IV SOLN
INTRAVENOUS | Status: DC
Start: 2023-06-14 — End: 2023-06-14

## 2023-06-14 MED ORDER — LOSARTAN POTASSIUM 25 MG PO TABS: 25.0000 mg | ORAL_TABLET | Freq: Every day | ORAL | 0 refills | Status: DC

## 2023-06-14 MED ORDER — SENNOSIDES-DOCUSATE SODIUM 8.6-50 MG PO TABS: 1.0000 | ORAL_TABLET | Freq: Every evening | ORAL | Status: DC | PRN

## 2023-06-14 MED ORDER — POTASSIUM CHLORIDE CRYS ER 20 MEQ PO TBCR
40.0000 meq | EXTENDED_RELEASE_TABLET | ORAL | Status: AC
  Administered 2023-06-14 (×2): 40 meq via ORAL
  Filled 2023-06-14 (×2): qty 2

## 2023-06-14 MED ORDER — ACETAMINOPHEN 325 MG PO TABS: 650.0000 mg | ORAL_TABLET | ORAL | Status: DC | PRN

## 2023-06-14 MED ORDER — CLOPIDOGREL BISULFATE 75 MG PO TABS
75.0000 mg | ORAL_TABLET | Freq: Every day | ORAL | Status: DC
  Administered 2023-06-14: 75 mg via ORAL
  Filled 2023-06-14: qty 1

## 2023-06-14 MED ORDER — ASPIRIN 81 MG PO TBEC: 81.0000 mg | DELAYED_RELEASE_TABLET | Freq: Every day | ORAL | Status: AC

## 2023-06-14 NOTE — ED Notes (Addendum)
 Patient transported to CT

## 2023-06-14 NOTE — H&P (Signed)
 PCP:   Mliss Sax, MD   Chief Complaint:  Upper extremity right-sided numbness  HPI: This is a 58 year old male with past medical history of T2DM, gout.  This a.m. he woke up with right arm tingling.  He initially thought he slept wrong, went back to sleep.  He woke several hours later with persistent tingling, then noting he had numbness on his right side mostly in the upper extremity.  He endorses some mild weakness, some difficulty manipulating his coffee cup.  He additionally endorses mild dizziness, slight headache, no nausea or vomiting.  He called his PCP who recommended he go to the ER.  In the ER presenting vitals 154/96, 77, 18, afebrile.  UDS positive for THC. CT head negative MRI brain showed acute left thalamocapsular infarct EKG: NSR, normal QTc Neurohospitalist consult placed  Review of Systems:  Per HPI  Past Medical History: Past Medical History:  Diagnosis Date   Arthritis    Diabetes mellitus without complication (HCC)    Pre-diabetic   Gout    Mass of left parotid gland    Past Surgical History:  Procedure Laterality Date   PAROTIDECTOMY Left 12/09/2019   Procedure: LEFT TOTALPAROTIDECTOMY;  Surgeon: Newman Pies, MD;  Location: [facility];  Service: ENT;  Laterality: Left;  REQUESTING  RNFA FOR START   TONSILLECTOMY      Medications: Prior to Admission medications   Medication Sig Start Date End Date Taking? Authorizing Provider  acetaminophen (TYLENOL) 500 MG tablet Take 500 mg by mouth as needed for headache.   Yes [provider]  atorvastatin (LIPITOR) 10 MG tablet Take 1 tablet (10 mg total) by mouth daily. 02/12/23  Yes Mliss Sax, MD  carboxymethylcellulose (REFRESH PLUS) 0.5 % SOLN Place 2 drops into both eyes as needed (dry eyes after contact use).   Yes [provider]  colchicine 0.6 MG tablet Take 1.2mg  initially followed by 0.6mg  1 hour later.  Continue 0.6mg  daily until resolution Patient  taking differently: Take 0.6 mg by mouth daily as needed (Gout flare). Take 1.2mg  initially followed by 0.6mg  1 hour later.  Continue 0.6mg  daily until resolution 11/25/21  Yes Mliss Sax, MD  indomethacin (INDOCIN) 50 MG capsule TAKE 1 CAPSULE BY MOUTH THREE TIMES DAILY AS NEEDED FOR GOUT ATTACKS 11/08/22  Yes Mliss Sax, MD  metFORMIN (GLUCOPHAGE-XR) 500 MG 24 hr tablet TAKE 1 TABLET(500 MG) BY MOUTH TWICE DAILY WITH A MEAL 02/15/23  Yes Mliss Sax, MD  triamcinolone cream (KENALOG) 0.1 % Applied to rash on legs twice daily as needed. Patient taking differently: Apply 1 Application topically daily as needed (Eczema). Applied to rash on legs twice daily as needed. 06/08/21  Yes Mliss Sax, MD    Allergies:  No Known Allergies  Social History:  reports that he quit smoking about 26 years ago. His smoking use included cigarettes. He has never used smokeless tobacco. He reports current alcohol use. He reports that he does not use drugs.  Family History: Family History  Problem Relation Age of Onset   Healthy Mother    Gout Maternal Grandmother    Gout Paternal Grandfather    Colon cancer Neg Hx    Colon polyps Neg Hx    Esophageal cancer Neg Hx    Rectal cancer Neg Hx    Stomach cancer Neg Hx     Physical Exam: Vitals:   06/13/23 1915 06/13/23 2045 06/13/23 2058 06/13/23 2345  BP: (!) 139/104 Marland Kitchen)  144/86  (!) 161/94  Pulse: 77 71  67  Resp: 14 (!) 21  13  Temp:   99.2 F (37.3 C)   TempSrc:   Oral   SpO2: 99% 98%  98%  Weight:      Height:        General:  A&Ox3, well developed and nourished, no acute distress Eyes: PERRLA, pink conjunctiva, no scleral icterus ENT: Moist oral mucosa, neck supple, no thyromegaly Lungs: CTA B/L, no wheeze, no crackles, no use of accessory muscles Cardiovascular: RRR, no murmurs. No carotid bruits, no JVD Abdomen: soft, positive BS, NTND not an acute abdomen GU: not examined Neuro: CN II - XII  grossly intact, sensation intact Musculoskeletal: strength 5/5 all extremities, no edema Skin: no rash, no subcutaneous crepitation, no decubitus Psych: appropriate patient  Labs on Admission:  Recent Labs    06/13/23 1254 06/13/23 1317  NA 140 142  K 3.6 3.6  CL 108 109  CO2 21*  --   GLUCOSE 111* 104*  BUN 13 12  CREATININE 0.91 0.90  CALCIUM 10.0  --    Recent Labs    06/13/23 1254  AST 25  ALT 39  ALKPHOS 81  BILITOT 0.7  PROT 7.6  ALBUMIN 4.1    Recent Labs    06/13/23 1254 06/13/23 1317  WBC 10.4  --   NEUTROABS 6.9  --   HGB 16.3 16.3  HCT 47.7 48.0  MCV 97.1  --   PLT 280  --     Radiological Exams on Admission: MR BRAIN WO CONTRAST Result Date: 06/13/2023 CLINICAL DATA:  Neuro deficit, acute, stroke suspected EXAM: MRI HEAD WITHOUT CONTRAST TECHNIQUE: Multiplanar, multiecho pulse sequences of the brain and surrounding structures were obtained without intravenous contrast. COMPARISON:  CT head from today. FINDINGS: Brain: Acute left thalamocapsular infarct. Slight edema without mass effect. No midline shift. No hydrocephalus. No mass lesion. Moderate patchy T2/FLAIR hyperintensities in the white matter are nonspecific but compatible with chronic microvascular ischemic disease. Vascular: Major arterial flow voids are maintained at the skull base. Skull and upper cervical spine: Normal marrow signal. Sinuses/Orbits: Right maxillary sinus retention cyst. Mostly clear sinuses. No acute orbital findings. Other: No mastoid effusions. IMPRESSION: Acute left thalamocapsular infarct. Electronically Signed   By: Feliberto Harts M.D.   On: 06/13/2023 20:59   CT HEAD WO CONTRAST Result Date: 06/13/2023 CLINICAL DATA:  Right-sided numbness beginning at 3 a.m. EXAM: CT HEAD WITHOUT CONTRAST TECHNIQUE: Contiguous axial images were obtained from the base of the skull through the vertex without intravenous contrast. RADIATION DOSE REDUCTION: This exam was performed according to  the departmental dose-optimization program which includes automated exposure control, adjustment of the mA and/or kV according to patient size and/or use of iterative reconstruction technique. COMPARISON:  None Available. FINDINGS: Brain: Scattered subcortical white matter hypoattenuation is present bilaterally, mildly advanced for age. No acute infarct, hemorrhage, or mass lesion is present. Deep brain nuclei are within normal limits. The ventricles are of normal size. No significant extraaxial fluid collection is present. The brainstem and cerebellum are within normal limits. Midline structures are within normal limits. Vascular: No hyperdense vessel or unexpected calcification. Skull: Calvarium is intact. No focal lytic or blastic lesions are present. Sinuses/Orbits: There is a polyp retention cyst is present in the right maxillary sinus. The paranasal sinuses and mastoid air cells are otherwise clear. IMPRESSION: 1. No acute intracranial abnormality or significant interval change. 2. Scattered subcortical white matter hypoattenuation bilaterally is mildly advanced  for age. This likely reflects the sequela of chronic microvascular ischemia. Electronically Signed   By: Marin Roberts M.D.   On: 06/13/2023 13:30    Assessment/Plan Present on Admission:  Acute CVA (cerebrovascular accident) (HCC) -TIA order set initiated -Neurochecks every hour x 12, followed by every 4 hours -ASA 650 mg given.  DAPT ordered -CTA head and neck, 2D echo with bubble study ordered -Permissive hypertension -Lipid panel, high-dose statin if LDL greater than 70 -PT/OT/speech consult placed -Neurohospitalist consult placed  HLD -LDL ordered  T2DM -Sliding scale insulin  Noemi Bellissimo 06/14/2023, 12:37 AM

## 2023-06-14 NOTE — Evaluation (Signed)
 Physical Therapy Evaluation and D/C Patient Details Name: Joshua Morse MRN: [id number] DOB: 1965-10-06 Today's Date: 06/14/2023  History of Present Illness  [name] is a 58 y.o. male admitted 3/5 admitted for acute onset right arm numbness.  MRI reveals acute left thalamocapsular infarct.  [id number], arthritis, gout and left parotid mass status post resection  Clinical Impression  Pt admitted with above diagnosis.  Pt currently without functional limitations and doesn't need PT f/u as symptoms mostly resolved.  Ambulating to bathroom in ED on his own.  Gave pt "Ischemic stroke" handout and reviewed risk factors. No PT f/u indicated and will sign off.     If plan is discharge home, recommend the following:     Can travel by private vehicle        Equipment Recommendations None recommended by PT  Recommendations for Other Services       Functional Status Assessment Patient has not had a recent decline in their functional status     Precautions / Restrictions Precautions Precautions: None Restrictions Weight Bearing Restrictions Per Provider Order: No      Mobility  Bed Mobility Overal bed mobility: Independent                  Transfers Overall transfer level: Independent                      Ambulation/Gait Ambulation/Gait assistance: Independent Gait Distance (Feet): 100 Feet Assistive device: None Gait Pattern/deviations: WFL(Within Functional Limits)   Gait velocity interpretation: 1.31 - 2.62 ft/sec, indicative of limited community Medical sales representative     Tilt Bed    Modified Rankin (Stroke Patients Only)       Balance Overall balance assessment: Independent                                           Pertinent Vitals/Pain Pain Assessment Pain Assessment: No/denies pain    Home Living Family/patient expects to be discharged to:: Private residence Living  Arrangements: Spouse/significant other Available Help at Discharge: Family Type of Home: House           Home Equipment: None      Prior Function Prior Level of Function : Independent/Modified Independent;Driving;Working/employed             Mobility Comments: I PTA ADLs Comments: No issues     Extremity/Trunk Assessment   Upper Extremity Assessment Upper Extremity Assessment: Overall WFL for tasks assessed    Lower Extremity Assessment Lower Extremity Assessment: Overall WFL for tasks assessed    Cervical / Trunk Assessment Cervical / Trunk Assessment: Normal  Communication   Communication Communication: No apparent difficulties    Cognition Arousal: Alert Behavior During Therapy: WFL for tasks assessed/performed   PT - Cognitive impairments: No apparent impairments                         Following commands: Intact       Cueing       General Comments General comments (skin integrity, edema, etc.): Gave ischemic stroke handout and reviewed risk factors.    Exercises     Assessment/Plan    PT Assessment Patient does not need any further PT services  PT Problem List  PT Treatment Interventions      PT Goals (Current goals can be found in the Care Plan section)  Acute Rehab PT Goals Patient Stated Goal: go home today PT Goal Formulation: All assessment and education complete, DC therapy    Frequency       Co-evaluation               AM-PAC PT "6 Clicks" Mobility  Outcome Measure Help needed turning from your back to your side while in a flat bed without using bedrails?: None Help needed moving from lying on your back to sitting on the side of a flat bed without using bedrails?: None Help needed moving to and from a bed to a chair (including a wheelchair)?: None Help needed standing up from a chair using your arms (e.g., wheelchair or bedside chair)?: None Help needed to walk in hospital room?: None Help needed  climbing 3-5 steps with a railing? : None 6 Click Score: 24    End of Session   Activity Tolerance: Patient tolerated treatment well Patient left: with call bell/phone within reach Nurse Communication: Mobility status PT Visit Diagnosis: Muscle weakness (generalized) (M62.81)    Time: 1610-9604 PT Time Calculation (min) (ACUTE ONLY): 14 min   Charges:   PT Evaluation $PT Eval Low Complexity: 1 Low   PT General Charges $$ ACUTE PT VISIT: 1 Visit         Aloni Chuang M,PT Acute Rehab Services [phone]   Bevelyn Buckles 06/14/2023, 1:56 PM

## 2023-06-14 NOTE — Plan of Care (Addendum)
 Discharge instructions discussed with patient.  Patient instructed on home medications, restrictions, and follow up appointments. Belongings gathered and sent with patient.  Patients medications sent to [facility].   Pt discharged by this Clinical research associate.

## 2023-06-14 NOTE — Discharge Summary (Signed)
 Physician Discharge Summary  Joshua Morse HQI:[id number] DOB: 1965-06-04 DOA: 06/13/2023  PCP: Mliss Sax, MD  Admit date: 06/13/2023 Discharge date: 06/14/23  Admitted From: Home Disposition: Home Recommendations for Outpatient Follow-up:  Follow up with PCP in 1 to 2 [place] To reassess blood pressure, glycemic control, CBC and CMP at follow-up. Please follow up on the following pending results: None  Home Health: No need identified Equipment/Devices: No need identified  Discharge Condition: Stable CODE STATUS: Full code  Follow-up Information     Mliss Sax, MD. Schedule an appointment as soon as possible for a visit in 1 week(s).   Specialty: Family Medicine Contact information: [address] [place] [address] [phone]                 Hospital course 58 year old M with PMH of DM-2, marijuana use and gout presenting with acute right arm numbness, tingling and incoordination and found to have left thalamocapsular CVA on MRI.  CT head without acute finding.  MRI brain showed acute left thalamic infarct and moderate patchy T2/FLAIR hyperintensities in the white matter compatible with chronic microvascular ischemic disease.  CT angio head and neck without significant finding.  UDS positive for marijuana.  LDL 66.  A1c 6.7%.  Neurology consulted.  Echocardiogram ordered.  Patient was started on Plavix and aspirin.   The next day, patient's symptoms basically resolved.  Echocardiogram without significant finding.  He is cleared for discharge on Plavix and aspirin for 60 [place]tem[place] followed by low-dose aspirin alone.  He is already on a statin.     See individual problem list below for more.   Problems addressed during this hospitalization Acute ischemic left thalamic infarct: Presents with acute right arm numbness and tingling with right arm incoordination. CT angio head and neck without significant finding.  LDL 66.  A1c 6.7%.  TTE without  significant finding.  Symptoms basically resolved. -Cleared for discharge on aspirin and Plavix for 60 [place] followed by low-dose aspirin alone -Continue home Lipitor 10 mg daily.  LDL only 66. -Encouraged marijuana cessation.   NIDDM-2 with hyperglycemia: A1c 6.7%.  On metformin at home. -Continue home metformin.  Consider titrating metformin  Elevated blood pressure: No history of hypertension.   -Start low-dose losartan. -Reassess BP, renal functions and electrolytes at follow-up   Hypokalemia: Mg within normal. -Replenished prior to discharge.   Marijuana use: UDS positive for marijuana -Encourage cessation.   Gout without acute flare: Takes colchicine for flareup            Time spent 35 minutes  Vital signs Vitals:   06/14/23 1100 06/14/23 1200 06/14/23 1300 06/14/23 1426  BP: (!) 156/104 (!) 148/100 (!) 154/89 (!) 164/97  Pulse: 73 74  80  Temp:    98.2 F (36.8 C)  Resp: 20 18 17 18   Height:      Weight:      SpO2: 97% 99%  97%  TempSrc:    Oral  BMI (Calculated):         Discharge exam  GENERAL: No apparent distress.  Nontoxic. HEENT: MMM.  Vision and hearing grossly intact.  NECK: Supple.  No apparent JVD.  RESP:  No IWOB.  Fair aeration bilaterally. CVS:  RRR. Heart sounds normal.  ABD/GI/GU: BS+. Abd soft, NTND.  MSK/EXT:  Moves extremities. No apparent deformity. No edema.  SKIN: no apparent skin lesion or wound NEURO: Awake, alert and oriented appropriately. Speech clear. Cranial nerves II-XII intact. Motor 5/5 in  all muscle groups of UE and LE bilaterally, Normal tone. Light sensation intact in all dermatomes except slightly diminished light sensation over right upper arm laterally.  Patellar reflex symmetric.  No pronator drift.  Finger to nose intact. PSYCH: Calm. Normal affect.   Discharge Instructions Discharge Instructions     Diet general   Complete by: As directed    Discharge instructions   Complete by: As directed    It has been a  pleasure taking care of you!  You were hospitalized due to right arm numbness and tingling.  Your MRI showed stroke in the left side of your brain that could explain your symptoms.  Your symptoms basically resolved.  We have started you on aspirin and Plavix to reduce your risk of further stroke.  We have also added low-dose losartan for your blood pressure.  Please take your medications as prescribed.  We recommend stopping marijuana.  Follow-up with your primary care doctor in 1 to 2 [place] or sooner if needed.   Take care,   Increase activity slowly   Complete by: As directed       Allergies as of 06/14/2023   No Known Allergies      Medication List     TAKE these medications    acetaminophen 500 MG tablet Commonly known as: TYLENOL Take 500 mg by mouth as needed for headache.   aspirin EC 81 MG tablet Take 1 tablet (81 mg total) by mouth daily. Swallow whole. Start taking on: June 15, 2023   atorvastatin 10 MG tablet Commonly known as: LIPITOR Take 1 tablet (10 mg total) by mouth daily.   carboxymethylcellulose 0.5 % Soln Commonly known as: REFRESH PLUS Place 2 drops into both eyes as needed (dry eyes after contact use).   clopidogrel 75 MG tablet Commonly known as: PLAVIX Take 1 tablet (75 mg total) by mouth daily for 21 days. Start taking on: June 15, 2023   colchicine 0.6 MG tablet Take 1.2mg  initially followed by 0.6mg  1 hour later.  Continue 0.6mg  daily until resolution What changed:  how much to take how to take this when to take this reasons to take this   indomethacin 50 MG capsule Commonly known as: INDOCIN TAKE 1 CAPSULE BY MOUTH THREE TIMES DAILY AS NEEDED FOR GOUT ATTACKS   losartan 25 MG tablet Commonly known as: Cozaar Take 1 tablet (25 mg total) by mouth daily.   metFORMIN 500 MG 24 hr tablet Commonly known as: GLUCOPHAGE-XR TAKE 1 TABLET(500 MG) BY MOUTH TWICE DAILY WITH A MEAL   triamcinolone cream 0.1 % Commonly known as:  KENALOG Applied to rash on legs twice daily as needed. What changed:  how much to take how to take this when to take this reasons to take this        Consultations: Neurology  Procedures/Studies:   ECHOCARDIOGRAM COMPLETE BUBBLE STUDY Result Date: 06/14/2023    ECHOCARDIOGRAM REPORT   Patient Name:   Joshua Morse Date of Exam: 06/14/2023 Medical Rec #:  [id number]     Height:       72.0 in Accession #:    [id number]    Weight:       215.0 lb Date of Birth:  14-Dec-1965     BSA:          2.197 m Patient Age:    57 years      BP:           154/89 mmHg Patient Gender: M  HR:           76 bpm. Exam Location:  Inpatient Procedure: 2D Echo, Cardiac Doppler, Color Doppler, Saline Contrast Bubble Study            and Intracardiac Opacification Agent (Both Spectral and Color Flow            Doppler were utilized during procedure). Indications:    CVA  History:        Patient has no prior history of Echocardiogram examinations.                 Stroke; Risk Factors:Diabetes.  Sonographer:    [name] Snowball Referring Phys: [phone] DEBBY CROSLEY IMPRESSIONS  1. Left ventricular ejection fraction, by estimation, is 55 to 60%. The left ventricle has normal function. The left ventricle has no regional wall motion abnormalities. Left ventricular diastolic parameters were normal.  2. Right ventricular systolic function is normal. The right ventricular size is normal.  3. The mitral valve is normal in structure. Trivial mitral valve regurgitation.  4. The aortic valve is tricuspid. Aortic valve regurgitation is not visualized. Aortic valve sclerosis/calcification is present, without any evidence of aortic stenosis.  5. Agitated saline contrast bubble study was negative, with no evidence of any interatrial shunt. FINDINGS  Left Ventricle: Left ventricular ejection fraction, by estimation, is 55 to 60%. The left ventricle has normal function. The left ventricle has no regional wall motion abnormalities. Definity  contrast agent was given IV to delineate the left ventricular  endocardial borders. The left ventricular internal cavity size was normal in size. There is no left ventricular hypertrophy. Left ventricular diastolic parameters were normal. Right Ventricle: The right ventricular size is normal. Right vetricular wall thickness was not assessed. Right ventricular systolic function is normal. Left Atrium: Left atrial size was normal in size. Right Atrium: Right atrial size was normal in size. Pericardium: There is no evidence of pericardial effusion. Mitral Valve: The mitral valve is normal in structure. Trivial mitral valve regurgitation. MV peak gradient, 2.5 mmHg. The mean mitral valve gradient is 1.0 mmHg. Tricuspid Valve: The tricuspid valve is normal in structure. Tricuspid valve regurgitation is mild. Aortic Valve: The aortic valve is tricuspid. Aortic valve regurgitation is not visualized. Aortic valve sclerosis/calcification is present, without any evidence of aortic stenosis. Pulmonic Valve: The pulmonic valve was normal in structure. Pulmonic valve regurgitation is not visualized. Aorta: The aortic root and ascending aorta are structurally normal, with no evidence of dilitation. IAS/Shunts: No atrial level shunt detected by color flow Doppler. Agitated saline contrast was given intravenously to evaluate for intracardiac shunting. Agitated saline contrast bubble study was negative, with no evidence of any interatrial shunt.  LEFT VENTRICLE PLAX 2D LVIDd:         5.20 cm   Diastology LVIDs:         3.80 cm   LV e' medial:    5.87 cm/s LV PW:         0.90 cm   LV E/e' medial:  9.3 LV IVS:        0.90 cm   LV e' lateral:   11.30 cm/s LVOT diam:     2.00 cm   LV E/e' lateral: 4.8 LVOT Area:     3.14 cm  RIGHT VENTRICLE RV Basal diam:  3.80 cm RV S prime:     11.20 cm/s TAPSE (M-mode): 1.9 cm LEFT ATRIUM           Index  RIGHT ATRIUM           Index LA diam:      3.90 cm 1.78 cm/m   RA Area:     12.70 cm LA  Vol (A2C): 38.5 ml 17.52 ml/m  RA Volume:   28.30 ml  12.88 ml/m LA Vol (A4C): 40.3 ml 18.34 ml/m   AORTA Ao Root diam: 3.40 cm Ao Asc diam:  3.50 cm MITRAL VALVE MV Area (PHT): 2.33 cm    SHUNTS MV Peak grad:  2.5 mmHg    Systemic Diam: 2.00 cm MV Mean grad:  1.0 mmHg MV Vmax:       0.79 m/s MV Vmean:      54.2 cm/s MV Decel Time: 325 msec MV E velocity: 54.80 cm/s MV A velocity: 65.60 cm/s MV E/A ratio:  0.84 Dietrich Pates MD Electronically signed by Dietrich Pates MD Signature Date/Time: 06/14/2023/3:55:54 PM    Final    CT ANGIO HEAD NECK W WO CM Result Date: 06/14/2023 CLINICAL DATA:  Neuro deficit, acute, stroke suspected cva EXAM: CT ANGIOGRAPHY HEAD AND NECK WITH AND WITHOUT CONTRAST TECHNIQUE: Multidetector CT imaging of the head and neck was performed using the standard protocol during bolus administration of intravenous contrast. Multiplanar CT image reconstructions and MIPs were obtained to evaluate the vascular anatomy. Carotid stenosis measurements (when applicable) are obtained utilizing NASCET criteria, using the distal internal carotid diameter as the denominator. RADIATION DOSE REDUCTION: This exam was performed according to the departmental dose-optimization program which includes automated exposure control, adjustment of the mA and/or kV according to patient size and/or use of iterative reconstruction technique. CONTRAST:  75mL OMNIPAQUE IOHEXOL 350 MG/ML SOLN COMPARISON:  None Available. FINDINGS: CT HEAD FINDINGS Brain: No evidence of acute large vascular territory infarction, hemorrhage, hydrocephalus, extra-axial collection or mass lesion/mass effect. Patchy white matter hypodensities are nonspecific but compatible with chronic microvascular ischemic disease. Vascular: See below. Skull: No acute fracture. Sinuses/Orbits: No acute findings. Other: No mastoid effusions. Review of the MIP images confirms the above findings CTA NECK FINDINGS Aortic arch: Great vessel origins are patent without  significant stenosis. Aortic atherosclerosis. Right carotid system: Atherosclerosis at the carotid bifurcation without greater than 50% stenosis. No evidence of dissection. Left carotid system: No evidence of dissection, stenosis (50% or greater), or occlusion. Vertebral arteries: Left dominant. No evidence of dissection, stenosis (50% or greater), or occlusion. Skeleton: No acute abnormality on limited assessment. Multilevel degenerative change. Other neck: No acute abnormality on limited assessment. Upper chest: Visualized lung apices are clear. Review of the MIP images confirms the above findings CTA HEAD FINDINGS Anterior circulation: Bilateral intracranial ICAs, MCAs, and ACAs are patent without proximal hemodynamically significant stenosis. No aneurysm identified. Posterior circulation: Bilateral intradural vertebral arteries, basilar artery and bilateral posterior cerebral arteries are patent without proximal hemodynamically significant stenosis. No aneurysm identified Venous sinuses: As permitted by contrast timing, patent. Review of the MIP images confirms the above findings IMPRESSION: No emergent large vessel occlusion or proximal hemodynamically significant stenosis. Electronically Signed   By: Feliberto Harts M.D.   On: 06/14/2023 02:21   MR BRAIN WO CONTRAST Result Date: 06/13/2023 CLINICAL DATA:  Neuro deficit, acute, stroke suspected EXAM: MRI HEAD WITHOUT CONTRAST TECHNIQUE: Multiplanar, multiecho pulse sequences of the brain and surrounding structures were obtained without intravenous contrast. COMPARISON:  CT head from today. FINDINGS: Brain: Acute left thalamocapsular infarct. Slight edema without mass effect. No midline shift. No hydrocephalus. No mass lesion. Moderate patchy T2/FLAIR hyperintensities in the white matter are  nonspecific but compatible with chronic microvascular ischemic disease. Vascular: Major arterial flow voids are maintained at the skull base. Skull and upper cervical  spine: Normal marrow signal. Sinuses/Orbits: Right maxillary sinus retention cyst. Mostly clear sinuses. No acute orbital findings. Other: No mastoid effusions. IMPRESSION: Acute left thalamocapsular infarct. Electronically Signed   By: Feliberto Harts M.D.   On: 06/13/2023 20:59   CT HEAD WO CONTRAST Result Date: 06/13/2023 CLINICAL DATA:  Right-sided numbness beginning at 3 a.m. EXAM: CT HEAD WITHOUT CONTRAST TECHNIQUE: Contiguous axial images were obtained from the base of the skull through the vertex without intravenous contrast. RADIATION DOSE REDUCTION: This exam was performed according to the departmental dose-optimization program which includes automated exposure control, adjustment of the mA and/or kV according to patient size and/or use of iterative reconstruction technique. COMPARISON:  None Available. FINDINGS: Brain: Scattered subcortical white matter hypoattenuation is present bilaterally, mildly advanced for age. No acute infarct, hemorrhage, or mass lesion is present. Deep brain nuclei are within normal limits. The ventricles are of normal size. No significant extraaxial fluid collection is present. The brainstem and cerebellum are within normal limits. Midline structures are within normal limits. Vascular: No hyperdense vessel or unexpected calcification. Skull: Calvarium is intact. No focal lytic or blastic lesions are present. Sinuses/Orbits: There is a polyp retention cyst is present in the right maxillary sinus. The paranasal sinuses and mastoid air cells are otherwise clear. IMPRESSION: 1. No acute intracranial abnormality or significant interval change. 2. Scattered subcortical white matter hypoattenuation bilaterally is mildly advanced for age. This likely reflects the sequela of chronic microvascular ischemia. Electronically Signed   By: Marin Roberts M.D.   On: 06/13/2023 13:30       The results of significant diagnostics from this hospitalization (including imaging,  microbiology, ancillary and laboratory) are listed below for reference.     Microbiology: No results found for this or any previous visit (from the past 240 hours).   Labs:  CBC: Recent Labs  Lab 06/13/23 1254 06/13/23 1317  WBC 10.4  --   NEUTROABS 6.9  --   HGB 16.3 16.3  HCT 47.7 48.0  MCV 97.1  --   PLT 280  --    BMP &GFR Recent Labs  Lab 06/13/23 1254 06/13/23 1317 06/14/23 0408  NA 140 142 139  K 3.6 3.6 3.3*  CL 108 109 109  CO2 21*  --  21*  GLUCOSE 111* 104* 116*  BUN 13 12 10   CREATININE 0.91 0.90 0.82  CALCIUM 10.0  --  9.2  MG  --   --  2.2   Estimated Creatinine Clearance: 117.4 mL/min (by C-G formula based on SCr of 0.82 mg/dL). Liver & Pancreas: Recent Labs  Lab 06/13/23 1254 06/14/23 0408  AST 25 25  ALT 39 34  ALKPHOS 81 69  BILITOT 0.7 0.8  PROT 7.6 6.6  ALBUMIN 4.1 3.5   No results for input(s): "LIPASE", "AMYLASE" in the last 168 hours. No results for input(s): "AMMONIA" in the last 168 hours. Diabetic: Recent Labs    06/13/23 1254  HGBA1C 6.7*   Recent Labs  Lab 06/13/23 1247 06/14/23 0118 06/14/23 0346 06/14/23 0736 06/14/23 1210  GLUCAP 136* 232* 116* 109* 99   Cardiac Enzymes: No results for input(s): "CKTOTAL", "CKMB", "CKMBINDEX", "TROPONINI" in the last 168 hours. No results for input(s): "PROBNP" in the last 8760 hours. Coagulation Profile: Recent Labs  Lab 06/13/23 1254  INR 1.0   Thyroid Function Tests: No results for input(s): "  TSH", "T4TOTAL", "FREET4", "T3FREE", "THYROIDAB" in the last 72 hours. Lipid Profile: Recent Labs    06/14/23 0408  CHOL 130  HDL 34*  LDLCALC 66  TRIG 914  CHOLHDL 3.8   Anemia Panel: No results for input(s): "VITAMINB12", "FOLATE", "FERRITIN", "TIBC", "IRON", "RETICCTPCT" in the last 72 hours. Urine analysis:    Component Value Date/Time   COLORURINE YELLOW 06/13/2023 2036   APPEARANCEUR CLEAR 06/13/2023 2036   LABSPEC 1.014 06/13/2023 2036   PHURINE 5.0 06/13/2023  2036   GLUCOSEU NEGATIVE 06/13/2023 2036   GLUCOSEU NEGATIVE 08/16/2022 0849   HGBUR NEGATIVE 06/13/2023 2036   BILIRUBINUR NEGATIVE 06/13/2023 2036   KETONESUR 5 (A) 06/13/2023 2036   PROTEINUR NEGATIVE 06/13/2023 2036   UROBILINOGEN 0.2 08/16/2022 0849   NITRITE NEGATIVE 06/13/2023 2036   LEUKOCYTESUR NEGATIVE 06/13/2023 2036   Sepsis Labs: Invalid input(s): "PROCALCITONIN", "LACTICIDVEN"   SIGNED:  Almon Hercules, MD  [facility] 06/14/2023, 4:19 PM

## 2023-06-14 NOTE — Progress Notes (Addendum)
 STROKE TEAM PROGRESS NOTE   INTERIM HISTORY/SUBJECTIVE  Remained hemodynamically stable and afebrile overnight.  He continues to endorse decreased sensation in his right arm, and fine finger movements on the right are noted to be decreased.  He is looking forward to his golf tournament this weekend.  OBJECTIVE  CBC    Component Value Date/Time   WBC 10.4 06/13/2023 1254   RBC 4.91 06/13/2023 1254   HGB 16.3 06/13/2023 1317   HCT 48.0 06/13/2023 1317   PLT 280 06/13/2023 1254   MCV 97.1 06/13/2023 1254   MCH 33.2 06/13/2023 1254   MCHC 34.2 06/13/2023 1254   RDW 12.7 06/13/2023 1254   LYMPHSABS 2.3 06/13/2023 1254   MONOABS 0.9 06/13/2023 1254   EOSABS 0.2 06/13/2023 1254   BASOSABS 0.1 06/13/2023 1254    BMET    Component Value Date/Time   NA 139 06/14/2023 0408   K 3.3 (L) 06/14/2023 0408   CL 109 06/14/2023 0408   CO2 21 (L) 06/14/2023 0408   GLUCOSE 116 (H) 06/14/2023 0408   BUN 10 06/14/2023 0408   CREATININE 0.82 06/14/2023 0408   CREATININE 0.89 02/15/2018 1408   CALCIUM 9.2 06/14/2023 0408   GFRNONAA >60 06/14/2023 0408    IMAGING past 24 hours CT ANGIO HEAD NECK W WO CM Result Date: 06/14/2023 CLINICAL DATA:  Neuro deficit, acute, stroke suspected cva EXAM: CT ANGIOGRAPHY HEAD AND NECK WITH AND WITHOUT CONTRAST TECHNIQUE: Multidetector CT imaging of the head and neck was performed using the standard protocol during bolus administration of intravenous contrast. Multiplanar CT image reconstructions and MIPs were obtained to evaluate the vascular anatomy. Carotid stenosis measurements (when applicable) are obtained utilizing NASCET criteria, using the distal internal carotid diameter as the denominator. RADIATION DOSE REDUCTION: This exam was performed according to the departmental dose-optimization program which includes automated exposure control, adjustment of the mA and/or kV according to patient size and/or use of iterative reconstruction technique. CONTRAST:  75mL  OMNIPAQUE IOHEXOL 350 MG/ML SOLN COMPARISON:  None Available. FINDINGS: CT HEAD FINDINGS Brain: No evidence of acute large vascular territory infarction, hemorrhage, hydrocephalus, extra-axial collection or mass lesion/mass effect. Patchy white matter hypodensities are nonspecific but compatible with chronic microvascular ischemic disease. Vascular: See below. Skull: No acute fracture. Sinuses/Orbits: No acute findings. Other: No mastoid effusions. Review of the MIP images confirms the above findings CTA NECK FINDINGS Aortic arch: Great vessel origins are patent without significant stenosis. Aortic atherosclerosis. Right carotid system: Atherosclerosis at the carotid bifurcation without greater than 50% stenosis. No evidence of dissection. Left carotid system: No evidence of dissection, stenosis (50% or greater), or occlusion. Vertebral arteries: Left dominant. No evidence of dissection, stenosis (50% or greater), or occlusion. Skeleton: No acute abnormality on limited assessment. Multilevel degenerative change. Other neck: No acute abnormality on limited assessment. Upper chest: Visualized lung apices are clear. Review of the MIP images confirms the above findings CTA HEAD FINDINGS Anterior circulation: Bilateral intracranial ICAs, MCAs, and ACAs are patent without proximal hemodynamically significant stenosis. No aneurysm identified. Posterior circulation: Bilateral intradural vertebral arteries, basilar artery and bilateral posterior cerebral arteries are patent without proximal hemodynamically significant stenosis. No aneurysm identified Venous sinuses: As permitted by contrast timing, patent. Review of the MIP images confirms the above findings IMPRESSION: No emergent large vessel occlusion or proximal hemodynamically significant stenosis. Electronically Signed   By: Feliberto Harts M.D.   On: 06/14/2023 02:21   MR BRAIN WO CONTRAST Result Date: 06/13/2023 CLINICAL DATA:  Neuro deficit, acute, stroke  suspected EXAM: MRI HEAD WITHOUT CONTRAST TECHNIQUE: Multiplanar, multiecho pulse sequences of the brain and surrounding structures were obtained without intravenous contrast. COMPARISON:  CT head from today. FINDINGS: Brain: Acute left thalamocapsular infarct. Slight edema without mass effect. No midline shift. No hydrocephalus. No mass lesion. Moderate patchy T2/FLAIR hyperintensities in the white matter are nonspecific but compatible with chronic microvascular ischemic disease. Vascular: Major arterial flow voids are maintained at the skull base. Skull and upper cervical spine: Normal marrow signal. Sinuses/Orbits: Right maxillary sinus retention cyst. Mostly clear sinuses. No acute orbital findings. Other: No mastoid effusions. IMPRESSION: Acute left thalamocapsular infarct. Electronically Signed   By: Feliberto Harts M.D.   On: 06/13/2023 20:59   CT HEAD WO CONTRAST Result Date: 06/13/2023 CLINICAL DATA:  Right-sided numbness beginning at 3 a.m. EXAM: CT HEAD WITHOUT CONTRAST TECHNIQUE: Contiguous axial images were obtained from the base of the skull through the vertex without intravenous contrast. RADIATION DOSE REDUCTION: This exam was performed according to the departmental dose-optimization program which includes automated exposure control, adjustment of the mA and/or kV according to patient size and/or use of iterative reconstruction technique. COMPARISON:  None Available. FINDINGS: Brain: Scattered subcortical white matter hypoattenuation is present bilaterally, mildly advanced for age. No acute infarct, hemorrhage, or mass lesion is present. Deep brain nuclei are within normal limits. The ventricles are of normal size. No significant extraaxial fluid collection is present. The brainstem and cerebellum are within normal limits. Midline structures are within normal limits. Vascular: No hyperdense vessel or unexpected calcification. Skull: Calvarium is intact. No focal lytic or blastic lesions are  present. Sinuses/Orbits: There is a polyp retention cyst is present in the right maxillary sinus. The paranasal sinuses and mastoid air cells are otherwise clear. IMPRESSION: 1. No acute intracranial abnormality or significant interval change. 2. Scattered subcortical white matter hypoattenuation bilaterally is mildly advanced for age. This likely reflects the sequela of chronic microvascular ischemia. Electronically Signed   By: Marin Roberts M.D.   On: 06/13/2023 13:30    Vitals:   06/14/23 0630 06/14/23 0633 06/14/23 0952 06/14/23 1000  BP: 128/67  (!) 140/92 (!) 150/95  Pulse: 67  78 68  Resp: 19  17 14   Temp:  99.3 F (37.4 C) 98.3 F (36.8 C)   TempSrc:  Oral Oral   SpO2: 96%  98% 100%  Weight:      Height:         PHYSICAL EXAM General:  Alert, well-nourished, well-developed patient in no acute distress Psych:  Mood and affect appropriate for situation Respiratory:  Regular, unlabored respirations on room air   NEURO:  Mental Status: AA&Ox3, patient is able to give clear and coherent history Speech/Language: speech is without dysarthria or aphasia.  Naming, repetition, fluency, and comprehension intact.  Cranial Nerves:  II: PERRL. Visual fields full.  III, IV, VI: EOMI. Eyelids elevate symmetrically.  V: Sensation is intact to light touch and symmetrical to face.  VII: Face is symmetrical resting and smiling VIII: hearing intact to voice. IX, X: Phonation is normal.  [id number] shrug 5/5. XII: tongue is midline without fasciculations. Motor: 5/5 strength to all muscle groups tested.  Fine finger movements diminished on the right, left arm orbits right Tone: is normal and bulk is normal Sensation- Intact to light touch bilaterally but diminished in the right arm. Extinction absent to light touch to DSS.   Coordination: FTN intact bilaterally, Gait- deferred  Most Recent NIH  1a Level of Conscious.: 0 1b LOC Questions:  0 1c LOC Commands: 0 2 Best Gaze:  0 3 Visual: 0 4 Facial Palsy: 0 5a Motor Arm - left: 0 5b Motor Arm - Right: 0 6a Motor Leg - Left: 0 6b Motor Leg - Right: 0 7 Limb Ataxia: 0 8 Sensory: 1 9 Best Language: 0 10 Dysarthria: 0 11 Extinct. and Inatten.: 0 TOTAL: 1   ASSESSMENT/PLAN  [name] is a 58 y.o. male with history of prediabetes, arthritis, gout and left parotid mass status post resection admitted for acute onset right arm numbness.  MRI reveals acute left thalamocapsular infarct.  Stroke:  left thalamocapsular infarct, etiology: Likely small vessel disease CT head No acute abnormality. Small vessel disease.  CTA head & neck no LVO or hemodynamically significant stenosis MRI acute left thalamocapsular infarct 2D Echo EF 55 to 60% LDL 66 HgbA1c 6.7 VTE prophylaxis -Lovenox No antithrombotic prior to admission, now on aspirin 81 mg daily and clopidogrel 75 mg daily for 3 [place] and then aspirin alone. Therapy recommendations: None Disposition: home  Hypertension Home meds: None Stable Long-term BP goal normotensive  Hyperlipidemia Home meds: Atorvastatin 10 mg daily, resumed in hospital LDL 66, goal < 70 High intensity statin not indicated as LDL below goal Continue statin at discharge  Diabetes type II Controlled Home meds: Metformin 500 mg twice daily HgbA1c 6.7, goal < 7.0 CBGs SSI Recommend close follow-up with PCP for better DM control  Substance Abuse Patient uses marijuana UDS positive for  [facility]  Cessation education provided Patient is willing to quit  Other Stroke Risk Factors Overweight, BMI 29.16  Other Active Problems Left parotid gland mass status post resection  Hospital day # 0  Patient seen by NP and then by MD, MD to edit note is needed. Cortney E Ernestina Columbia , MSN, AGACNP-BC [facility] See Amion for schedule and pager information 06/14/2023 12:01 PM  ATTENDING NOTE: I reviewed above note and agree with the assessment and plan. Pt was seen and  examined.   Daughter at bedside.  Patient lying bed, stated that his symptoms has resolved except right ulnar side slight sensory loss.  Examination no focal neuro deficit.  Patient stroke consistent with small vessel disease with multiple stroke risk factors.  Continue DAPT for 3 Reas and then as alone, continue low-dose statin.  Quit marijuana.  PT and OT no recommendation.  For detailed assessment and plan, please refer to above/below as I have made changes wherever appropriate.   Neurology will sign off. Please call with questions. Pt will follow up with stroke clinic NP at [facility] in about 4 Reuss. Thanks for the consult.   Marvel Plan, MD PhD Stroke Neurology 06/14/2023 10:23 PM      To contact Stroke Continuity provider, please refer to [email or web address]. After hours, contact General Neurology

## 2023-06-14 NOTE — Evaluation (Signed)
 Occupational Therapy Evaluation Patient Details Name: Joshua Morse MRN: [id number] DOB: November 02, 1965 Today's Date: 06/14/2023   History of Present Illness   Joshua Morse is a 58 y.o. male admitted 3/5 admitted for acute onset right arm numbness.  MRI reveals acute left thalamocapsular infarct.  [id number]:[id number], arthritis, gout and left parotid mass status post resection     Clinical Impressions Prior to admission patient was completely independent, working and driving. Currently only has a very minor decrease in R fine motor coordination and mild proprioceptive deficits in the R hand.  Patient is independent with adls / iadls.  No follow up OT is recommended at this time.  Instructed patient to reach out to MD if R hand issues did not resolve or became concerning with everyday activities.  He verbalized and demonstrated understanding.       If plan is discharge home, recommend the following:   Assist for transportation     Functional Status Assessment   Patient has not had a recent decline in their functional status     Equipment Recommendations   None recommended by OT     Recommendations for Other Services         Precautions/Restrictions   Precautions Precautions: None Restrictions Weight Bearing Restrictions Per Provider Order: No     Mobility Bed Mobility Overal bed mobility: Independent                  Transfers Overall transfer level: Independent                        Balance Overall balance assessment: Independent                                         ADL either performed or assessed with clinical judgement   ADL Overall ADL's : Independent                                       General ADL Comments: Able to button clothes, open containers     Vision Baseline Vision/History: 0 No visual deficits Vision Assessment?: No apparent visual deficits     Perception Perception: Within  Functional Limits       Praxis Praxis: WFL       Pertinent Vitals/Pain Pain Assessment Pain Assessment: No/denies pain     Extremity/Trunk Assessment Upper Extremity Assessment Upper Extremity Assessment: RUE deficits/detail;Right hand dominant RUE Deficits / Details: AROM and Strength WNL RUE Sensation: decreased proprioception (light touch intact; minimal decrease proprioception in pinky finger.  Patient reports hand feeling fat) RUE Coordination: decreased fine motor (very minimal delay with diadochokinetic  movement in R fingers; able to sign name and trace, but per patient it is minimally "off")   Lower Extremity Assessment Lower Extremity Assessment: Defer to PT evaluation   Cervical / Trunk Assessment Cervical / Trunk Assessment: Normal   Communication Communication Communication: No apparent difficulties   Cognition Arousal: Alert Behavior During Therapy: WFL for tasks assessed/performed Cognition: No apparent impairments                               Following commands: Intact       Cueing  General Comments      able  to demonstrate teachback for fine motor activities and OT recommendations for follow up   Exercises     Shoulder Instructions      Home Living Family/patient expects to be discharged to:: Private residence Living Arrangements: Spouse/significant other Available Help at Discharge: Family Type of Home: House                       Home Equipment: None          Prior Functioning/Environment Prior Level of Function : Independent/Modified Independent;Driving;Working/employed             Mobility Comments: I PTA ADLs Comments: working and driving    OT Problem List: Impaired sensation   OT Treatment/Interventions:        OT Goals(Current goals can be found in the care plan section)       OT Frequency:       Co-evaluation              AM-PAC OT "6 Clicks" Daily Activity     Outcome  Measure Help from another person eating meals?: None Help from another person taking care of personal grooming?: None Help from another person toileting, which includes using toliet, bedpan, or urinal?: None Help from another person bathing (including washing, rinsing, drying)?: None Help from another person to put on and taking off regular upper body clothing?: None Help from another person to put on and taking off regular lower body clothing?: None 6 Click Score: 24   End of Session Nurse Communication: Mobility status  Activity Tolerance: Patient tolerated treatment well Patient left: in bed;with call bell/phone within reach;with bed alarm set  OT Visit Diagnosis: Muscle weakness (generalized) (M62.81)                Time: 4098-1191 OT Time Calculation (min): 30 min Charges:  OT General Charges $OT Visit: 1 Visit OT Evaluation $OT Eval Low Complexity: 1 Low OT Treatments $Self Care/Home Management : 8-22 mins  Joshua Morse OTR/L   Joshua Morse 06/14/2023, 3:56 PM

## 2023-06-14 NOTE — ED Notes (Signed)
 ECHO at bedside.

## 2023-06-14 NOTE — TOC Transition Note (Signed)
 Transition of Care [facility]) - Discharge Note   Patient Details  Name: Joshua Morse MRN: [id number] Date of Birth: September 25, 1965  [facility] CM/SW Contact:  Kermit Balo, RN Phone Number: 06/14/2023, 4:16 PM   Clinical Narrative:     Pt is discharging home with self care. No follow up per therapies.  Pt has transportation home.  Final next level of care: Home/Self Care Barriers to Discharge: No Barriers Identified   Patient Goals and CMS Choice            Discharge Placement                       Discharge Plan and Services Additional resources added to the After Visit Summary for                                       Social Drivers of Health (SDOH) Interventions SDOH Screenings   Food Insecurity: No Food Insecurity (02/09/2023)  Housing: Low Risk  (02/09/2023)  Transportation Needs: No Transportation Needs (02/09/2023)  Alcohol Screen: Medium Risk (02/09/2023)  Depression (PHQ2-9): Low Risk  (08/11/2022)  Financial Resource Strain: Low Risk  (02/09/2023)  Physical Activity: Insufficiently Active (02/09/2023)  Social Connections: Moderately Integrated (02/09/2023)  Stress: Stress Concern Present (02/09/2023)  Tobacco Use: Medium Risk (06/13/2023)     Readmission Risk Interventions     No data to display

## 2023-06-14 NOTE — Progress Notes (Signed)
 PROGRESS NOTE  Joshua Morse ZOX:[id number] DOB: Sep 07, 1965   PCP: Mliss Sax, MD  Patient is from: Home.  DOA: 06/13/2023 LOS: 0  Chief complaints Chief Complaint  Patient presents with   arm numbness    right     Brief Narrative / Interim history: 58 year old M with PMH of DM-2, marijuana use and gout presenting with acute right arm numbness, tingling and incoordination and found to have left thalamocapsular CVA on MRI.  CT head without acute finding.  MRI brain showed acute left thalamic infarct and moderate patchy T2/FLAIR hyperintensities in the white matter compatible with chronic microvascular ischemic disease.  CT angio head and neck without significant finding.  UDS positive for marijuana.  LDL 66.  A1c 6.7%.  Neurology consulted.  Echocardiogram ordered.  Patient was started on Plavix and aspirin.   Subjective: Seen and examined earlier this morning.  No major events overnight of this morning.  He states his numbness has almost resolved except in his right upper arm laterally.  No complaints.  He does not smoke.  Objective: Vitals:   06/14/23 0600 06/14/23 0630 06/14/23 0633 06/14/23 0952  BP: 125/70 128/67  (!) 140/92  Pulse: 66 67  78  Resp: 17 19  17   Temp:   99.3 F (37.4 C) 98.3 F (36.8 C)  TempSrc:   Oral Oral  SpO2: 96% 96%  98%  Weight:      Height:        Examination:  GENERAL: No apparent distress.  Nontoxic. HEENT: MMM.  Vision and hearing grossly intact.  NECK: Supple.  No apparent JVD.  RESP:  No IWOB.  Fair aeration bilaterally. CVS:  RRR. Heart sounds normal.  ABD/GI/GU: BS+. Abd soft, NTND.  MSK/EXT:  Moves extremities. No apparent deformity. No edema.  SKIN: no apparent skin lesion or wound NEURO: Awake, alert and oriented appropriately. Speech clear. Cranial nerves II-XII intact. Motor 5/5 in all muscle groups of UE and LE bilaterally, Normal tone. Light sensation intact in all dermatomes except slightly diminished light sensation  over right upper arm laterally.  Patellar reflex symmetric.  No pronator drift.  Finger to nose intact. PSYCH: Calm. Normal affect.   Procedures:  None  Microbiology summarized: None  Assessment and plan: Acute ischemic left thalamic infarct: Presents with acute right arm numbness and tingling with right arm incoordination.  Symptoms nearly resolved.  CT angio head and neck without significant finding.  LDL 66.  A1c 6.7%. -Appreciate input by neurology-aspirin and Plavix for 21 days followed by aspirin alone, Lipitor 40 mg daily -Follow echocardiogram -Continue telemetry monitoring.  NIDDM-2 with hyperglycemia: A1c 6.7%.  On metformin at home. Recent Labs  Lab 06/13/23 1247 06/14/23 0118 06/14/23 0346 06/14/23 0736  GLUCAP 136* 232* 116* 109*  -Continue SSI-moderate  Hypokalemia: Mg within normal. -Monitor replenish as appropriate.  Marijuana use: UDS positive for marijuana -Encourage cessation.  Gout without acute flare: Takes colchicine for flareup   Body mass index is 29.16 kg/m.          DVT prophylaxis:  enoxaparin (LOVENOX) injection 40 mg Start: 06/14/23 1000  Code Status: Full code Family Communication: None at bedside Level of care: Telemetry Medical Status is: Inpatient Remains inpatient appropriate because: Acute left thalamic CVA   Final disposition: Home Consultants:  Neurology  35 minutes with more than 50% spent in reviewing records, counseling patient/family and coordinating care.   Sch Meds:  Scheduled Meds:  [START ON 06/15/2023]  stroke: early stages of recovery book  Does not apply Once   aspirin EC  81 mg Oral Daily   atorvastatin  10 mg Oral Daily   clopidogrel  75 mg Oral Daily   enoxaparin (LOVENOX) injection  40 mg Subcutaneous Q24H   insulin aspart  0-15 Units Subcutaneous Q4H   potassium chloride  40 mEq Oral Q4H   Continuous Infusions:  sodium chloride 40 mL/hr at 06/14/23 1035   PRN Meds:.acetaminophen **OR**  acetaminophen (TYLENOL) oral liquid 160 mg/5 mL **OR** acetaminophen, senna-docusate  Antimicrobials: Anti-infectives (From admission, onward)    None        I have personally reviewed the following labs and images: CBC: Recent Labs  Lab 06/13/23 1254 06/13/23 1317  WBC 10.4  --   NEUTROABS 6.9  --   HGB 16.3 16.3  HCT 47.7 48.0  MCV 97.1  --   PLT 280  --    BMP &GFR Recent Labs  Lab 06/13/23 1254 06/13/23 1317 06/14/23 0408  NA 140 142 139  K 3.6 3.6 3.3*  CL 108 109 109  CO2 21*  --  21*  GLUCOSE 111* 104* 116*  BUN 13 12 10   CREATININE 0.91 0.90 0.82  CALCIUM 10.0  --  9.2  MG  --   --  2.2   Estimated Creatinine Clearance: 117.4 mL/min (by C-G formula based on SCr of 0.82 mg/dL). Liver & Pancreas: Recent Labs  Lab 06/13/23 1254 06/14/23 0408  AST 25 25  ALT 39 34  ALKPHOS 81 69  BILITOT 0.7 0.8  PROT 7.6 6.6  ALBUMIN 4.1 3.5   No results for input(s): "LIPASE", "AMYLASE" in the last 168 hours. No results for input(s): "AMMONIA" in the last 168 hours. Diabetic: Recent Labs    06/13/23 1254  HGBA1C 6.7*   Recent Labs  Lab 06/13/23 1247 06/14/23 0118 06/14/23 0346 06/14/23 0736  GLUCAP 136* 232* 116* 109*   Cardiac Enzymes: No results for input(s): "CKTOTAL", "CKMB", "CKMBINDEX", "TROPONINI" in the last 168 hours. No results for input(s): "PROBNP" in the last 8760 hours. Coagulation Profile: Recent Labs  Lab 06/13/23 1254  INR 1.0   Thyroid Function Tests: No results for input(s): "TSH", "T4TOTAL", "FREET4", "T3FREE", "THYROIDAB" in the last 72 hours. Lipid Profile: Recent Labs    06/14/23 0408  CHOL 130  HDL 34*  LDLCALC 66  TRIG 161  CHOLHDL 3.8   Anemia Panel: No results for input(s): "VITAMINB12", "FOLATE", "FERRITIN", "TIBC", "IRON", "RETICCTPCT" in the last 72 hours. Urine analysis:    Component Value Date/Time   COLORURINE YELLOW 06/13/2023 2036   APPEARANCEUR CLEAR 06/13/2023 2036   LABSPEC 1.014 06/13/2023  2036   PHURINE 5.0 06/13/2023 2036   GLUCOSEU NEGATIVE 06/13/2023 2036   GLUCOSEU NEGATIVE 08/16/2022 0849   HGBUR NEGATIVE 06/13/2023 2036   BILIRUBINUR NEGATIVE 06/13/2023 2036   KETONESUR 5 (A) 06/13/2023 2036   PROTEINUR NEGATIVE 06/13/2023 2036   UROBILINOGEN 0.2 08/16/2022 0849   NITRITE NEGATIVE 06/13/2023 2036   LEUKOCYTESUR NEGATIVE 06/13/2023 2036   Sepsis Labs: Invalid input(s): "PROCALCITONIN", "LACTICIDVEN"  Microbiology: No results found for this or any previous visit (from the past 240 hours).  Radiology Studies: CT ANGIO HEAD NECK W WO CM Result Date: 06/14/2023 CLINICAL DATA:  Neuro deficit, acute, stroke suspected cva EXAM: CT ANGIOGRAPHY HEAD AND NECK WITH AND WITHOUT CONTRAST TECHNIQUE: Multidetector CT imaging of the head and neck was performed using the standard protocol during bolus administration of intravenous contrast. Multiplanar CT image reconstructions and MIPs were obtained to evaluate the  vascular anatomy. Carotid stenosis measurements (when applicable) are obtained utilizing NASCET criteria, using the distal internal carotid diameter as the denominator. RADIATION DOSE REDUCTION: This exam was performed according to the departmental dose-optimization program which includes automated exposure control, adjustment of the mA and/or kV according to patient size and/or use of iterative reconstruction technique. CONTRAST:  75mL OMNIPAQUE IOHEXOL 350 MG/ML SOLN COMPARISON:  None Available. FINDINGS: CT HEAD FINDINGS Brain: No evidence of acute large vascular territory infarction, hemorrhage, hydrocephalus, extra-axial collection or mass lesion/mass effect. Patchy white matter hypodensities are nonspecific but compatible with chronic microvascular ischemic disease. Vascular: See below. Skull: No acute fracture. Sinuses/Orbits: No acute findings. Other: No mastoid effusions. Review of the MIP images confirms the above findings CTA NECK FINDINGS Aortic arch: Great vessel  origins are patent without significant stenosis. Aortic atherosclerosis. Right carotid system: Atherosclerosis at the carotid bifurcation without greater than 50% stenosis. No evidence of dissection. Left carotid system: No evidence of dissection, stenosis (50% or greater), or occlusion. Vertebral arteries: Left dominant. No evidence of dissection, stenosis (50% or greater), or occlusion. Skeleton: No acute abnormality on limited assessment. Multilevel degenerative change. Other neck: No acute abnormality on limited assessment. Upper chest: Visualized lung apices are clear. Review of the MIP images confirms the above findings CTA HEAD FINDINGS Anterior circulation: Bilateral intracranial ICAs, MCAs, and ACAs are patent without proximal hemodynamically significant stenosis. No aneurysm identified. Posterior circulation: Bilateral intradural vertebral arteries, basilar artery and bilateral posterior cerebral arteries are patent without proximal hemodynamically significant stenosis. No aneurysm identified Venous sinuses: As permitted by contrast timing, patent. Review of the MIP images confirms the above findings IMPRESSION: No emergent large vessel occlusion or proximal hemodynamically significant stenosis. Electronically Signed   By: Feliberto Harts M.D.   On: 06/14/2023 02:21   MR BRAIN WO CONTRAST Result Date: 06/13/2023 CLINICAL DATA:  Neuro deficit, acute, stroke suspected EXAM: MRI HEAD WITHOUT CONTRAST TECHNIQUE: Multiplanar, multiecho pulse sequences of the brain and surrounding structures were obtained without intravenous contrast. COMPARISON:  CT head from today. FINDINGS: Brain: Acute left thalamocapsular infarct. Slight edema without mass effect. No midline shift. No hydrocephalus. No mass lesion. Moderate patchy T2/FLAIR hyperintensities in the white matter are nonspecific but compatible with chronic microvascular ischemic disease. Vascular: Major arterial flow voids are maintained at the skull base.  Skull and upper cervical spine: Normal marrow signal. Sinuses/Orbits: Right maxillary sinus retention cyst. Mostly clear sinuses. No acute orbital findings. Other: No mastoid effusions. IMPRESSION: Acute left thalamocapsular infarct. Electronically Signed   By: Feliberto Harts M.D.   On: 06/13/2023 20:59   CT HEAD WO CONTRAST Result Date: 06/13/2023 CLINICAL DATA:  Right-sided numbness beginning at 3 a.m. EXAM: CT HEAD WITHOUT CONTRAST TECHNIQUE: Contiguous axial images were obtained from the base of the skull through the vertex without intravenous contrast. RADIATION DOSE REDUCTION: This exam was performed according to the departmental dose-optimization program which includes automated exposure control, adjustment of the mA and/or kV according to patient size and/or use of iterative reconstruction technique. COMPARISON:  None Available. FINDINGS: Brain: Scattered subcortical white matter hypoattenuation is present bilaterally, mildly advanced for age. No acute infarct, hemorrhage, or mass lesion is present. Deep brain nuclei are within normal limits. The ventricles are of normal size. No significant extraaxial fluid collection is present. The brainstem and cerebellum are within normal limits. Midline structures are within normal limits. Vascular: No hyperdense vessel or unexpected calcification. Skull: Calvarium is intact. No focal lytic or blastic lesions are present. Sinuses/Orbits: There is a  polyp retention cyst is present in the right maxillary sinus. The paranasal sinuses and mastoid air cells are otherwise clear. IMPRESSION: 1. No acute intracranial abnormality or significant interval change. 2. Scattered subcortical white matter hypoattenuation bilaterally is mildly advanced for age. This likely reflects the sequela of chronic microvascular ischemia. Electronically Signed   By: Marin Roberts M.D.   On: 06/13/2023 13:30      Jeanenne Licea T. Randeep Biondolillo [facility]  If 7PM-7AM, please contact  night-coverage [email or web address] 06/14/2023, 10:49 AM

## 2023-06-18 ENCOUNTER — Telehealth: Payer: Self-pay | Admitting: Family Medicine

## 2023-06-18 NOTE — Telephone Encounter (Signed)
 Please call pt for toc

## 2023-06-18 NOTE — Telephone Encounter (Signed)
 Pt scheduled via mychart a hosp f/up with Dr Doreene Burke tomorrow 06/19/23. He was admitted.

## 2023-06-19 ENCOUNTER — Encounter: Payer: Self-pay | Admitting: Family Medicine

## 2023-06-19 ENCOUNTER — Ambulatory Visit: Admitting: Family Medicine

## 2023-06-19 VITALS — BP 118/82 | HR 89 | Temp 97.9°F | Ht 72.0 in | Wt 211.2 lb

## 2023-06-19 DIAGNOSIS — E7800 Pure hypercholesterolemia, unspecified: Secondary | ICD-10-CM | POA: Diagnosis not present

## 2023-06-19 DIAGNOSIS — R7303 Prediabetes: Secondary | ICD-10-CM | POA: Diagnosis not present

## 2023-06-19 DIAGNOSIS — I6930 Unspecified sequelae of cerebral infarction: Secondary | ICD-10-CM | POA: Diagnosis not present

## 2023-06-19 MED ORDER — LOSARTAN POTASSIUM 25 MG PO TABS: 25.0000 mg | ORAL_TABLET | Freq: Every day | ORAL | 3 refills | Status: AC

## 2023-06-19 MED ORDER — METFORMIN HCL ER 500 MG PO TB24: 500.0000 mg | ORAL_TABLET | Freq: Two times a day (BID) | ORAL | 1 refills | Status: DC

## 2023-06-19 NOTE — Progress Notes (Signed)
 Established Patient Office Visit   Subjective:  Patient ID: Joshua Morse, male    DOB: October 28, 1965  Age: 58 y.o. MRN: [id number]  Chief Complaint  Patient presents with   Hospitalization Follow-up    Hospital follow up (Stroke). Pt complains of confusion and low energy, low stamina a dn weakness.     HPI Encounter Diagnoses  Name Primary?   Pre-diabetes Yes   Elevated cholesterol    History of CVA with residual deficit    Hospital discharge follow-up status post thalamic CVA 6 days ago.  Symptoms have resolved except for paresthesias in his lateral chest and right upper lateral arm.  Energy levels are down a bit.  Symptoms have otherwise resolved.  He was started on low-dose losartan 25 mg.  A1c was measured at 6.7.  LDL cholesterol was measured at 66 status post starting atorvastatin 10 mg daily this past November.  Since November he has reduced alcohol intake, and exercising by walking 30 minutes daily and has been able to lose 7 pounds.  His job is quite stressful.  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  He travels both domestically and globally often.  He had been in [facility] the week before his stroke.  He had consumed some THC Gummies while he was there.  He does not otherwise use marijuana.  He is on Plavix for 3 Heppler and then will continue an aspirin.   Review of Systems  Constitutional: Negative.   HENT: Negative.    Eyes:  Negative for blurred vision, discharge and redness.  Respiratory: Negative.    Cardiovascular: Negative.   Gastrointestinal:  Negative for abdominal pain.  Genitourinary: Negative.   Musculoskeletal: Negative.  Negative for myalgias.  Skin:  Negative for rash.  Neurological:  Positive for tingling. Negative for loss of consciousness and weakness.  Endo/Heme/Allergies:  Negative for polydipsia.     Current Outpatient Medications:    acetaminophen (TYLENOL) 500 MG tablet, Take 500 mg by mouth as needed for headache., Disp: , Rfl:     aspirin EC 81 MG tablet, Take 1 tablet (81 mg total) by mouth daily. Swallow whole., Disp: , Rfl:    atorvastatin (LIPITOR) 10 MG tablet, Take 1 tablet (10 mg total) by mouth daily., Disp: 90 tablet, Rfl: 3   carboxymethylcellulose (REFRESH PLUS) 0.5 % SOLN, Place 2 drops into both eyes as needed (dry eyes after contact use)., Disp: , Rfl:    clopidogrel (PLAVIX) 75 MG tablet, Take 1 tablet (75 mg total) by mouth daily for 21 days., Disp: 21 tablet, Rfl: 0   colchicine 0.6 MG tablet, Take 1.2mg  initially followed by 0.6mg  1 hour later.  Continue 0.6mg  daily until resolution (Patient taking differently: Take 0.6 mg by mouth daily as needed (Gout flare). Take 1.2mg  initially followed by 0.6mg  1 hour later.  Continue 0.6mg  daily until resolution), Disp: 30 tablet, Rfl: 2   indomethacin (INDOCIN) 50 MG capsule, TAKE 1 CAPSULE BY MOUTH THREE TIMES DAILY AS NEEDED FOR GOUT ATTACKS, Disp: 30 capsule, Rfl: 0   triamcinolone cream (KENALOG) 0.1 %, Applied to rash on legs twice daily as needed. (Patient taking differently: Apply 1 Application topically daily as needed (Eczema). Applied to rash on legs twice daily as needed.), Disp: 80 g, Rfl: 0   losartan (COZAAR) 25 MG tablet, Take 1 tablet (25 mg total) by mouth daily., Disp: 90 tablet, Rfl: 3   metFORMIN (GLUCOPHAGE-XR) 500 MG 24 hr tablet, Take 1 tablet (500 mg  total) by mouth 2 (two) times daily with a meal., Disp: 180 tablet, Rfl: 1   Objective:     BP 118/82   Pulse 89   Temp 97.9 F (36.6 C)   Ht 6' (1.829 m)   Wt 211 lb 3.2 oz (95.8 kg)   SpO2 97%   BMI 28.64 kg/m  BP Readings from Last 3 Encounters:  06/19/23 118/82  06/14/23 (!) 164/97  02/27/23 134/88   Wt Readings from Last 3 Encounters:  06/19/23 211 lb 3.2 oz (95.8 kg)  06/13/23 215 lb (97.5 kg)  02/27/23 218 lb (98.9 kg)      Physical Exam Constitutional:      General: He is not in acute distress.    Appearance: Normal appearance. He is not ill-appearing,  toxic-appearing or diaphoretic.  HENT:     Head: Normocephalic and atraumatic.     Right Ear: External ear normal.     Left Ear: External ear normal.     Mouth/Throat:     Mouth: Mucous membranes are moist.     Pharynx: Oropharynx is clear. No oropharyngeal exudate or posterior oropharyngeal erythema.  Eyes:     General: No scleral icterus.       Right eye: No discharge.        Left eye: No discharge.     Extraocular Movements: Extraocular movements intact.     Conjunctiva/sclera: Conjunctivae normal.     Pupils: Pupils are equal, round, and reactive to light.  Cardiovascular:     Rate and Rhythm: Normal rate and regular rhythm.  Pulmonary:     Effort: Pulmonary effort is normal. No respiratory distress.     Breath sounds: Normal breath sounds. No wheezing or rales.  Musculoskeletal:     Cervical back: No rigidity or tenderness.  Lymphadenopathy:     Cervical: No cervical adenopathy.  Skin:    General: Skin is warm and dry.  Neurological:     Mental Status: He is alert and oriented to person, place, and time.     Cranial Nerves: No cranial nerve deficit, dysarthria or facial asymmetry.     Motor: No weakness.  Psychiatric:        Mood and Affect: Mood normal.        Behavior: Behavior normal.      No results found for any visits on 06/19/23.    The ASCVD Risk score (Arnett DK, et al., 2019) failed to calculate for the following reasons:   Risk score cannot be calculated because patient has a medical history suggesting prior/existing ASCVD    Assessment & Plan:   Pre-diabetes -     Comprehensive metabolic panel -     metFORMIN HCl ER; Take 1 tablet (500 mg total) by mouth 2 (two) times daily with a meal.  Dispense: 180 tablet; Refill: 1 -     Losartan Potassium; Take 1 tablet (25 mg total) by mouth daily.  Dispense: 90 tablet; Refill: 3  Elevated cholesterol -     Comprehensive metabolic panel  History of CVA with residual deficit -     CBC -     Comprehensive  metabolic panel -     Ambulatory referral to Neurology    Return Should have follow-up appointment in early May already with me..  Have increased metformin to 500 mg twice daily.  Continue losartan 25 mg for renal protection.  He will continue weight loss efforts, alcohol moderation and exercise.  Advised him to avoid travel through April  and speak with his company about decreasing his frequent travel schedule.  Mliss Sax, MD

## 2023-06-20 LAB — COMPREHENSIVE METABOLIC PANEL
ALT: 38 U/L (ref 0–53)
AST: 22 U/L (ref 0–37)
Albumin: 5 g/dL (ref 3.5–5.2)
Alkaline Phosphatase: 107 U/L (ref 39–117)
BUN: 14 mg/dL (ref 6–23)
CO2: 23 meq/L (ref 19–32)
Calcium: 10.3 mg/dL (ref 8.4–10.5)
Chloride: 104 meq/L (ref 96–112)
Creatinine, Ser: 0.91 mg/dL (ref 0.40–1.50)
GFR: 92.06 mL/min (ref >60.00)
Glucose, Bld: 106 mg/dL — ABNORMAL HIGH (ref 70–99)
Potassium: 4.2 meq/L (ref 3.5–5.1)
Sodium: 139 meq/L (ref 135–145)
Total Bilirubin: 0.4 mg/dL (ref 0.2–1.2)
Total Protein: 7.8 g/dL (ref 6.0–8.3)

## 2023-06-20 LAB — CBC
HCT: 50.2 % (ref 39.0–52.0)
Hemoglobin: 16.6 g/dL (ref 13.0–17.0)
MCHC: 33 g/dL (ref 30.0–36.0)
MCV: 99.4 fl (ref 78.0–100.0)
Platelets: 318 10*3/uL (ref 150.0–400.0)
RBC: 5.05 Mil/uL (ref 4.22–5.81)
RDW: 13.5 % (ref 11.5–15.5)
WBC: 12 10*3/uL — ABNORMAL HIGH (ref 4.0–10.5)

## 2023-06-25 ENCOUNTER — Encounter: Payer: Self-pay | Admitting: Family Medicine

## 2023-07-10 NOTE — Progress Notes (Unsigned)
 [facility] [address] [place]. [place] [address] [phone]       HOSPITAL FOLLOW UP NOTE  [name] Date of Birth:  01/31/1966 Medical Record Number:  [id number]   Reason for Referral:  hospital stroke follow up    SUBJECTIVE:   CHIEF COMPLAINT:  No chief complaint on file.   HPI:   [name] is a 58 y.o. male with history of prediabetes, arthritis, gout and left parotid mass status post resection who presented to ED on 06/13/2023 with acute onset right arm numbness.  Stroke workup revealed left thalamocapsular infarct likely secondary to small vessel disease.  CTA head/neck unremarkable.  EF 55 to 60%.  LDL 66.  A1c 6.7.  Recommended DAPT for 3 Lac and aspirin alone and continued atorvastatin 10 mg daily.  Controlled DM on metformin.  UDS positive for THC with cessation counseling provided.  All symptoms resolved except right ulnar side slight sensory loss.  No therapy needs.        PERTINENT IMAGING  CT head No acute abnormality. Small vessel disease.  CTA head & neck no LVO or hemodynamically significant stenosis MRI acute left thalamocapsular infarct 2D Echo EF 55 to 60% LDL 66 HgbA1c 6.7    ROS:   14 system review of systems performed and negative with exception of ***  PMH:  Past Medical History:  Diagnosis Date   Arthritis    Diabetes mellitus without complication (HCC)    Pre-diabetic   Gout    Mass of left parotid gland     PSH:  Past Surgical History:  Procedure Laterality Date   PAROTIDECTOMY Left 12/09/2019   Procedure: LEFT TOTALPAROTIDECTOMY;  Surgeon: Newman Pies, MD;  Location: [facility];  Service: ENT;  Laterality: Left;  REQUESTING  RNFA FOR START   TONSILLECTOMY      Social History:  Social History   Socioeconomic History   Marital status: Married    Spouse name: Not on file   Number of children: Not on file   Years of education: Not on file   Highest education level:  Bachelor's degree (e.g., BA, AB, BS)  Occupational History   Not on file  Tobacco Use   Smoking status: Former    Current packs/day: 0.00    Types: Cigarettes    Quit date: 1999    Years since quitting: 26.2   Smokeless tobacco: Never  Vaping Use   Vaping status: Never Used  Substance and Sexual Activity   Alcohol use: Yes    Comment: social   Drug use: Never   Sexual activity: Yes  Other Topics Concern   Not on file  Social History Narrative   Not on file   Social Drivers of Health   Financial Resource Strain: Low Risk  (02/09/2023)   Overall Financial Resource Strain (CARDIA)    Difficulty of Paying Living Expenses: Not hard at all  Food Insecurity: No Food Insecurity (02/09/2023)   Hunger Vital Sign    Worried About Running Out of Food in the Last Year: Never true    Ran Out of Food in the Last Year: Never true  Transportation Needs: No Transportation Needs (02/09/2023)   PRAPARE - Administrator, Civil Service (Medical): No    Lack of Transportation (Non-Medical): No  Physical Activity: Insufficiently Active (02/09/2023)   Exercise Vital Sign    Days of Exercise per Week: 3 days    Minutes of Exercise per Session: 20 min  Stress: Stress Concern Present (02/09/2023)   [employer] of Occupational Health - Occupational Stress Questionnaire    Feeling of Stress : Very much  Social Connections: Moderately Integrated (02/09/2023)   Social Connection and Isolation Panel [NHANES]    Frequency of Communication with Friends and Family: Once a week    Frequency of Social Gatherings with Friends and Family: Once a week    Attends Religious Services: 1 to 4 times per year    Active Member of [employer] or Organizations: Yes    Attends Engineer, structural: More than 4 times per year    Marital Status: Married  Catering manager Violence: Not on file    Family History:  Family History  Problem Relation Age of Onset   Healthy Mother    Gout Maternal  Grandmother    Gout Paternal Grandfather    Colon cancer Neg Hx    Colon polyps Neg Hx    Esophageal cancer Neg Hx    Rectal cancer Neg Hx    Stomach cancer Neg Hx     Medications:   Current Outpatient Medications on File Prior to Visit  Medication Sig Dispense Refill   acetaminophen (TYLENOL) 500 MG tablet Take 500 mg by mouth as needed for headache.     aspirin EC 81 MG tablet Take 1 tablet (81 mg total) by mouth daily. Swallow whole.     atorvastatin (LIPITOR) 10 MG tablet Take 1 tablet (10 mg total) by mouth daily. 90 tablet 3   carboxymethylcellulose (REFRESH PLUS) 0.5 % SOLN Place 2 drops into both eyes as needed (dry eyes after contact use).     colchicine 0.6 MG tablet Take 1.2mg  initially followed by 0.6mg  1 hour later.  Continue 0.6mg  daily until resolution (Patient taking differently: Take 0.6 mg by mouth daily as needed (Gout flare). Take 1.2mg  initially followed by 0.6mg  1 hour later.  Continue 0.6mg  daily until resolution) 30 tablet 2   indomethacin (INDOCIN) 50 MG capsule TAKE 1 CAPSULE BY MOUTH THREE TIMES DAILY AS NEEDED FOR GOUT ATTACKS 30 capsule 0   losartan (COZAAR) 25 MG tablet Take 1 tablet (25 mg total) by mouth daily. 90 tablet 3   metFORMIN (GLUCOPHAGE-XR) 500 MG 24 hr tablet Take 1 tablet (500 mg total) by mouth 2 (two) times daily with a meal. 180 tablet 1   triamcinolone cream (KENALOG) 0.1 % Applied to rash on legs twice daily as needed. (Patient taking differently: Apply 1 Application topically daily as needed (Eczema). Applied to rash on legs twice daily as needed.) 80 g 0   No current facility-administered medications on file prior to visit.    Allergies:  No Known Allergies    OBJECTIVE:  Physical Exam  There were no vitals filed for this visit. There is no height or weight on file to calculate BMI. No results found.   General: well developed, well nourished, seated, in no evident distress Head: head normocephalic and atraumatic.   Neck:  supple with no carotid or supraclavicular bruits Cardiovascular: regular rate and rhythm, no murmurs Musculoskeletal: no deformity Skin:  no rash/petichiae Vascular:  Normal pulses all extremities   Neurologic Exam Mental Status: Awake and fully alert. Oriented to place and time. Recent and remote memory intact. Attention span, concentration and fund of knowledge appropriate. Mood and affect appropriate.  Cranial Nerves: Fundoscopic exam reveals sharp disc margins. Pupils equal, briskly reactive to light. Extraocular movements full without nystagmus. Visual fields full to confrontation. Hearing intact. Facial sensation intact.  Face, tongue, palate moves normally and symmetrically.  Motor: Normal bulk and tone. Normal strength in all tested extremity muscles Sensory.: intact to touch , pinprick , position and vibratory sensation.  Coordination: Rapid alternating movements normal in all extremities. Finger-to-nose and heel-to-shin performed accurately bilaterally. Gait and Station: Arises from chair without difficulty. Stance is normal. Gait demonstrates normal stride length and balance with ***. Tandem walk and heel toe ***.  Reflexes: 1+ and symmetric. Toes downgoing.     NIHSS  *** Modified Rankin  ***      ASSESSMENT: Joshua Morse is a 58 y.o. year old male with left thalamocapsular infarct in 06/2023 likely secondary to small vessel disease. Vascular risk factors include HTN, HLD, DM, and THC use.      PLAN:  Ischemic stroke:  Residual deficit: ***.  Continue aspirin 81mg  daily and atorvastatin (Lipitor) for secondary stroke prevention managed/prescribed by PCP.   Discussed secondary stroke prevention measures and importance of close PCP follow up for aggressive stroke risk factor management including BP goal<130/90, HLD with LDL goal<70 and DM with A1c.<7 .  Stroke labs 06/2023: LDL 66, A1c 6.7 I have gone over the pathophysiology of stroke, warning signs and symptoms, risk  factors and their management in some detail with instructions to go to the closest emergency room for symptoms of concern.     Follow up in *** or call earlier if needed   CC:  [facility] provider: Dr. Pearlean Brownie PCP: Mliss Sax, MD    I spent *** minutes of face-to-face and non-face-to-face time with patient.  This included previsit chart review including review of recent hospitalization, lab review, study review, order entry, electronic health record documentation, patient education regarding recent stroke including etiology, secondary stroke prevention measures and importance of managing stroke risk factors, residual deficits and typical recovery time and answered all other questions to patient satisfaction   Ihor Austin, AGNP-BC  [facility] [address] [place], [place] [address]  Phone [phone] Fax [phone] Note: This document was prepared with digital dictation and possible smart phrase technology. Any transcriptional errors that result from this process are unintentional.

## 2023-07-11 ENCOUNTER — Encounter: Payer: Self-pay | Admitting: Adult Health

## 2023-07-11 ENCOUNTER — Ambulatory Visit: Admitting: Adult Health

## 2023-07-11 VITALS — BP 131/86 | HR 86 | Ht 72.0 in | Wt 218.0 lb

## 2023-07-11 DIAGNOSIS — I6381 Other cerebral infarction due to occlusion or stenosis of small artery: Secondary | ICD-10-CM | POA: Diagnosis not present

## 2023-07-11 DIAGNOSIS — R202 Paresthesia of skin: Secondary | ICD-10-CM

## 2023-07-11 DIAGNOSIS — R519 Headache, unspecified: Secondary | ICD-10-CM

## 2023-07-11 MED ORDER — TOPIRAMATE 50 MG PO TABS: 50.0000 mg | ORAL_TABLET | Freq: Every day | ORAL | 11 refills | Status: DC

## 2023-07-11 NOTE — Patient Instructions (Addendum)
 Start Topamax 50mg  nightly for daily headaches - please call after 2-3 [place] if no benefit or sooner if difficulty tolerating   Please continue to monitor sleep - if you are interested in pursing sleep evaluation, please let me know   It will be very important to try to manage stress levels as much as possible as prolonged stress overtime can increase your risk of stroke and heard disease   Continue aspirin 81 mg daily  and atorvastatin for secondary stroke prevention  Continue to follow up with PCP regarding blood pressure, cholesterol and diabetes management  Maintain strict control of hypertension with blood pressure goal below 130/90, diabetes with hemoglobin A1c goal below 7.0 % and cholesterol with LDL cholesterol (bad cholesterol) goal below 70 mg/dL.   Signs of a Stroke? Follow the BEFAST method:  Balance Watch for a sudden loss of balance, trouble with coordination or vertigo Eyes Is there a sudden loss of vision in one or both eyes? Or double vision?  Face: Ask the person to smile. Does one side of the face droop or is it numb?  Arms: Ask the person to raise both arms. Does one arm drift downward? Is there weakness or numbness of a leg? Speech: Ask the person to repeat a simple phrase. Does the speech sound slurred/strange? Is the person confused ? Time: If you observe any of these signs, call 911.     Followup in the future with me in 4-6 months or call earlier if needed     Thank you for coming to see [place] at [facility]. I hope we have been able to provide you high quality care today.  You may receive a patient satisfaction survey over the next few [place]. We would appreciate your feedback and comments so that we may continue to improve ourselves and the health of our patients.   Topiramate Tablets What is this medication? TOPIRAMATE (toe PYRE a mate) prevents and controls seizures in people with epilepsy. It may also be used to prevent migraine  headaches. It works by calming overactive nerves in your body. This medicine may be used for other purposes; ask your health care provider or pharmacist if you have questions. COMMON BRAND NAME(S): Topamax, Topiragen What should I tell my care team before I take this medication? They need to know if you have any of these conditions: Bleeding disorder Kidney disease Lung disease Suicidal thoughts, plans, or attempt by you or a family member An unusual or allergic reaction to topiramate, other medications, foods, dyes, or preservatives Pregnant or trying to get pregnant Breast-feeding How should I use this medication? Take this medication by mouth with water. Take it as directed on the prescription label at the same time every day. Do not cut, crush or chew this medicine. Swallow the tablets whole. You can take it with or without food. If it upsets your stomach, take it with food. Keep taking it unless your care team tells you to stop. A special MedGuide will be given to you by the pharmacist with each prescription and refill. Be sure to read this information carefully each time. Talk to your care team about the use of this medication in children. While it may be prescribed for children as young as 2 years for selected conditions, precautions do apply. Overdosage: If you think you have taken too much of this medicine contact a poison control center or emergency room at once. NOTE: This medicine is only for you. Do not share this medicine  with others. What if I miss a dose? If you miss a dose, take it as soon as you can unless it is within 6 hours of the next dose. If it is within 6 hours of the next dose, skip the missed dose. Take the next dose at the normal time. Do not take double or extra doses. What may interact with this medication? Acetazolamide Alcohol Antihistamines for allergy, cough, and cold Aspirin and aspirin-like medications Atropine Certain medications for anxiety or  sleep Certain medications for bladder problems, such as oxybutynin, tolterodine Certain medications for depression, such as amitriptyline, fluoxetine, sertraline Certain medications for Parkinson disease, such as benztropine, trihexyphenidyl Certain medications for seizures, such as carbamazepine, lamotrigine, phenobarbital, phenytoin, primidone, valproic acid, zonisamide Certain medications for stomach problems, such as dicyclomine, hyoscyamine Certain medications for travel sickness, such as scopolamine Certain medications that treat or prevent blood clots, such as warfarin, enoxaparin, dalteparin, apixaban, dabigatran, rivaroxaban Digoxin Diltiazem Estrogen and progestin hormones General anesthetics, such as halothane, isoflurane, methoxyflurane, propofol Glyburide Hydrochlorothiazide Ipratropium Lithium Medications that relax muscles Metformin NSAIDs, medications for pain and inflammation, such as ibuprofen or naproxen Opioid medications for pain Phenothiazines, such as chlorpromazine, mesoridazine, prochlorperazine, thioridazine Pioglitazone This list may not describe all possible interactions. Give your health care provider a list of all the medicines, herbs, non-prescription drugs, or dietary supplements you use. Also tell them if you smoke, drink alcohol, or use illegal drugs. Some items may interact with your medicine. What should I watch for while using this medication? Visit your care team for regular checks on your progress. Tell your care team if your symptoms do not start to get better or if they get worse. Do not suddenly stop taking this medication. You may develop a severe reaction. Your care team will tell you how much medication to take. If your care team wants you to stop the medication, the dose may be slowly lowered over time to avoid any side effects. Wear a medical ID bracelet or chain. Carry a card that describes your condition. List the medications and doses you  take on the card. This medication may affect your coordination, reaction time, or judgment. Do not drive or operate machinery until you know how this medication affects you. Sit up or stand slowly to reduce the risk of dizzy or fainting spells. Drinking alcohol with this medication can increase the risk of these side effects. This medication may cause serious skin reactions. They can happen [place] to months after starting the medication. Contact your care team right away if you notice fevers or flu-like symptoms with a rash. The rash may be red or purple and then turn into blisters or peeling of the skin. You may also notice a red rash with swelling of the face, lips, or lymph nodes in your neck or under your arms. This medication may cause thoughts of suicide or depression. This includes sudden changes in mood, behaviors, or thoughts. These changes can happen at any time but are more common in the beginning of treatment or after a change in dose. Call your care team right away if you experience these thoughts or worsening depression. This medication may slow your child's growth if it is taken for a long time at high doses. Your child's care team will monitor your child's growth. Using this medication for a long time may weaken your bones. The risk of bone fractures may be increased. Talk to your care team about your bone health. Discuss this medication with your care team  if you may be pregnant. Serious birth defects can occur if you take this medication during pregnancy. There are benefits and risks to taking medications during pregnancy. Your care team can help you find the option that works for you. Contraception is recommended while taking this medication. Estrogen and progestin hormones may not work as well while you are taking this medication. Your care team can help you find the option that works for you. Talk to your care team before breastfeeding. Changes to your treatment plan may be needed. What  side effects may I notice from receiving this medication? Side effects that you should report to your care team as soon as possible: Allergic reactions--skin rash, itching, hives, swelling of the face, lips, tongue, or throat High acid level--trouble breathing, unusual weakness or fatigue, confusion, headache, fast or irregular heartbeat, nausea, vomiting High ammonia level--unusual weakness or fatigue, confusion, loss of appetite, nausea, vomiting, seizures Fever that does not go away, decrease in sweat Kidney stones--blood in the urine, pain or trouble passing urine, pain in the lower back or sides Redness, blistering, peeling or loosening of the skin, including inside the mouth Sudden eye pain or change in vision such as blurry vision, seeing halos around lights, vision loss Thoughts of suicide or self-harm, worsening mood, feelings of depression Side effects that usually do not require medical attention (report to your care team if they continue or are bothersome): Burning or tingling sensation in hands or feet Difficulty with paying attention, memory, or speech Dizziness Drowsiness Fatigue Loss of appetite with weight loss Slow or sluggish movements of the body This list may not describe all possible side effects. Call your doctor for medical advice about side effects. You may report side effects to FDA at 1-800-FDA-1088. Where should I keep my medication? Keep out of the reach of children and pets. Store between 15 and 30 degrees C (59 and 86 degrees F). Protect from moisture. Keep the container tightly closed. Get rid of any unused medication after the expiration date. To get rid of medications that are no longer needed or have expired: Take the medication to a medication take-back program. Check with your pharmacy or law enforcement to find a location. If you cannot return the medication, check the label or package insert to see if the medication should be thrown out in the garbage or  flushed down the toilet. If you are not sure, ask your care team. If it is safe to put it in the trash, empty the medication out of the container. Mix the medication with cat litter, dirt, coffee grounds, or other unwanted substance. Seal the mixture in a bag or container. Put it in the trash. NOTE: This sheet is a summary. It may not cover all possible information. If you have questions about this medicine, talk to your doctor, pharmacist, or health care provider.  2024 Elsevier/Gold Standard (2021-08-18 00:00:00)

## 2023-08-07 ENCOUNTER — Encounter: Payer: Self-pay | Admitting: Family Medicine

## 2023-08-07 ENCOUNTER — Ambulatory Visit: Admitting: Family Medicine

## 2023-08-07 VITALS — BP 122/78 | HR 76 | Temp 98.0°F | Ht 72.0 in | Wt 213.6 lb

## 2023-08-07 DIAGNOSIS — R7303 Prediabetes: Secondary | ICD-10-CM | POA: Diagnosis not present

## 2023-08-07 DIAGNOSIS — I6930 Unspecified sequelae of cerebral infarction: Secondary | ICD-10-CM | POA: Diagnosis not present

## 2023-08-07 DIAGNOSIS — E7800 Pure hypercholesterolemia, unspecified: Secondary | ICD-10-CM | POA: Diagnosis not present

## 2023-08-07 NOTE — Progress Notes (Signed)
 Established Patient Office Visit   Subjective:  Patient ID: Joshua Morse, male    DOB: [birth year]  Age: 58 y.o. MRN: [id number]  Chief Complaint  Patient presents with   Medical Management of Chronic Issues    6 month follow up. Pt is not fasting. No concerns    HPI Encounter Diagnoses  Name Primary?   Pre-diabetes Yes   Elevated cholesterol    History of CVA with residual deficit    Continues to recover from CVA.  Status post follow-up with neurology.  He continues with all medications as indicated above.  He has been able to lose some weight.  He continues to work on decreasing the stress at his job.   Review of Systems  Constitutional: Negative.   HENT: Negative.    Eyes:  Negative for blurred vision, discharge and redness.  Respiratory: Negative.    Cardiovascular: Negative.   Gastrointestinal:  Negative for abdominal pain.  Genitourinary: Negative.   Musculoskeletal: Negative.  Negative for myalgias.  Skin:  Negative for rash.  Neurological:  Negative for tingling, loss of consciousness and weakness.  Endo/Heme/Allergies:  Negative for polydipsia.     Current Outpatient Medications:    acetaminophen  (TYLENOL ) 500 MG tablet, Take 500 mg by mouth as needed for headache., Disp: , Rfl:    aspirin  EC 81 MG tablet, Take 1 tablet (81 mg total) by mouth daily. Swallow whole., Disp: , Rfl:    atorvastatin  (LIPITOR) 10 MG tablet, Take 1 tablet (10 mg total) by mouth daily., Disp: 90 tablet, Rfl: 3   carboxymethylcellulose (REFRESH PLUS) 0.5 % SOLN, Place 2 drops into both eyes as needed (dry eyes after contact use)., Disp: , Rfl:    colchicine  0.6 MG tablet, Take 1.2mg  initially followed by 0.6mg  1 hour later.  Continue 0.6mg  daily until resolution (Patient taking differently: Take 0.6 mg by mouth daily as needed (Gout flare). Take 1.2mg  initially followed by 0.6mg  1 hour later.  Continue 0.6mg  daily until resolution), Disp: 30 tablet, Rfl: 2   indomethacin  (INDOCIN ) 50 MG  capsule, TAKE 1 CAPSULE BY MOUTH THREE TIMES DAILY AS NEEDED FOR GOUT ATTACKS, Disp: 30 capsule, Rfl: 0   losartan  (COZAAR ) 25 MG tablet, Take 1 tablet (25 mg total) by mouth daily., Disp: 90 tablet, Rfl: 3   metFORMIN  (GLUCOPHAGE -XR) 500 MG 24 hr tablet, Take 1 tablet (500 mg total) by mouth 2 (two) times daily with a meal., Disp: 180 tablet, Rfl: 1   topiramate  (TOPAMAX ) 50 MG tablet, Take 1 tablet (50 mg total) by mouth at bedtime., Disp: 30 tablet, Rfl: 11   triamcinolone  cream (KENALOG ) 0.1 %, Applied to rash on legs twice daily as needed. (Patient taking differently: Apply 1 Application topically daily as needed (Eczema). Applied to rash on legs twice daily as needed.), Disp: 80 g, Rfl: 0   Objective:     BP 122/78 (Cuff Size: Normal)   Pulse 76   Temp 98 F (36.7 C) (Temporal)   Ht 6' (1.829 m)   Wt 213 lb 9.6 oz (96.9 kg)   SpO2 97%   BMI 28.97 kg/m  BP Readings from Last 3 Encounters:  08/07/23 122/78  07/11/23 131/86  06/19/23 118/82   Wt Readings from Last 3 Encounters:  08/07/23 213 lb 9.6 oz (96.9 kg)  07/11/23 218 lb (98.9 kg)  06/19/23 211 lb 3.2 oz (95.8 kg)      Physical Exam Constitutional:      General: He is not in acute distress.  Appearance: Normal appearance. He is not ill-appearing, toxic-appearing or diaphoretic.  HENT:     Head: Normocephalic and atraumatic.     Right Ear: External ear normal.     Left Ear: External ear normal.  Eyes:     General: No scleral icterus.       Right eye: No discharge.        Left eye: No discharge.     Extraocular Movements: Extraocular movements intact.     Conjunctiva/sclera: Conjunctivae normal.  Cardiovascular:     Rate and Rhythm: Normal rate and regular rhythm.  Pulmonary:     Effort: Pulmonary effort is normal. No respiratory distress.     Breath sounds: Normal breath sounds. No wheezing, rhonchi or rales.  Musculoskeletal:     Cervical back: No rigidity or tenderness.  Skin:    General: Skin is warm  and dry.  Neurological:     Mental Status: He is alert and oriented to person, place, and time.  Psychiatric:        Mood and Affect: Mood normal.        Behavior: Behavior normal.    Diabetic Foot Exam - Simple   Simple Foot Form Diabetic Foot exam was performed with the following findings: Yes 08/07/2023 11:26 AM  Visual Inspection See comments: Yes Sensation Testing Intact to touch and monofilament testing bilaterally: Yes Pulse Check Posterior Tibialis and Dorsalis pulse intact bilaterally: Yes Comments Feet are cavus bilaterally without lesions or rashes.  Sensory intact to light touch with monofilament line throughout.       No results found for any visits on 08/07/23.    The ASCVD Risk score (Arnett DK, et al., 2019) failed to calculate for the following reasons:   Risk score cannot be calculated because patient has a medical history suggesting prior/existing ASCVD    Assessment & Plan:   Pre-diabetes  Elevated cholesterol  History of CVA with residual deficit    Return in about 6 months (around 02/06/2024) for chronic disease follow-up, annual physical.    Tonna Frederic, MD

## 2023-12-12 NOTE — Progress Notes (Unsigned)
 Joshua Morse [address] [place]. [place] [address] [phone]       OFFICE FOLLOW UP NOTE  Joshua Morse Date of Birth:  [birth year] Medical Record Number:  [id number]    Primary neurologist: Dr. Rosemarie Reason for visit:  stroke follow up    SUBJECTIVE:   CHIEF COMPLAINT:  Chief Complaint  Patient presents with   Cerebrovascular Accident    Rm 3 alone Pt is well and stable, reports no new stroke concerns since last visit.  Headaches have improved, has had about 5-6 in the last month.     HPI:   Update 12/13/2022 JM: Patient returns for stroke follow-up visit.  Overall has been doing well since prior visit.  Reports improvement of headaches, currently experiencing about 1 to 2/week, previously daily.  Reports taking topiramate  as needed, never started nightly as he would forget dose and headaches gradually improved on their own. Reports resolution of headache after topiramate  dosage.  Continued RUE and right torso numbness without any significant changes since prior visit.  Does not interfere with daily activity or functioning. No new stroke/TIA symptoms.  Reports compliance on aspirin  and atorvastatin  without side effects.  Plans on follow-up visit with PCP next month.  No questions or concerns at this time.     History provided for reference purposes only  Initial visit 07/11/2023 JM: Patient is being seen for hospital follow-up unaccompanied.  He reports continued right sided torso numbness and mild RUE numbness but overall gradually improving.  Denies any weakness, speech impairment or cognitive changes.  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 He has not yet traveled since his stroke but is scheduled to travel to [place] in May.  He has been working with the company to reduce stress load  but has been having some pushback from this.  He has been experiencing bilateral headaches with tight band sensation mid afternoon almost daily since his stroke, at times can experience some imbalance/lightheadedness associated with the headaches.  Use of Tylenol  occasionally with some benefit.  Associated with some phonophobia and mild nausea, no photophobia.  Typically last a couple hours.  He does endorse adequate water intake.  Has been trying to work on routine exercise and walks daily during his lunch break.  Overall he typically sleeps well but can wake up occasionally to use the bathroom and has difficulty falling back asleep which was present prior to his stroke.  Has been experiencing some daytime fatigue but denies this being present prior to his stroke.  He has been told he snores occasionally, denies witnessed apneas or morning headaches.  Reports use of THC Gummies while in [facility] just prior to his stroke, has not used again since then.  Denies any excessive EtOH use or tobacco use. Completed 3 Jagoda DAPT, remains on aspirin  alone as well as atorvastatin  without side effects.  Blood pressure well-controlled.  Routinely follows with PCP for stroke risk factor management.    Stroke admission 06/13/2023 Mr. [name] is a 58 y.o. male with history of prediabetes, arthritis, gout and left parotid mass status post resection who presented to ED on 06/13/2023 with acute onset right arm numbness.  Stroke workup revealed left thalamocapsular infarct likely secondary to small vessel disease.  CTA head/neck unremarkable.  EF 55 to 60%.  LDL 66.  A1c 6.7.  Recommended DAPT for 3 Motyka and aspirin  alone and started atorvastatin  10 mg daily.  Controlled DM on metformin .  UDS positive for THC with cessation counseling provided.  All symptoms resolved except right ulnar side slight sensory loss.  No therapy needs.     PERTINENT IMAGING  CT head No acute abnormality. Small vessel disease.  CTA head &  neck no LVO or hemodynamically significant stenosis MRI acute left thalamocapsular infarct 2D Echo EF 55 to 60% LDL 66 HgbA1c 6.7    ROS:   14 system review of systems performed and negative with exception of those listed in HPI  PMH:  Past Medical History:  Diagnosis Date   Arthritis    Diabetes mellitus without complication (HCC)    Pre-diabetic   Gout    Mass of left parotid gland     PSH:  Past Surgical History:  Procedure Laterality Date   PAROTIDECTOMY Left 12/09/2019   Procedure: LEFT TOTALPAROTIDECTOMY;  Surgeon: Karis Clunes, MD;  Location: [facility];  Service: ENT;  Laterality: Left;  REQUESTING  RNFA FOR START   TONSILLECTOMY      Social History:  Social History   Socioeconomic History   Marital status: Married    Spouse name: Not on file   Number of children: Not on file   Years of education: Not on file   Highest education level: Bachelor's degree (e.g., BA, AB, BS)  Occupational History   Not on file  Tobacco Use   Smoking status: Former    Current packs/day: 0.00    Types: Cigarettes    Quit date: 1999    Years since quitting: 26.6   Smokeless tobacco: Never  Vaping Use   Vaping status: Never Used  Substance and Sexual Activity   Alcohol use: Yes    Comment: social   Drug use: Never   Sexual activity: Yes  Other Topics Concern   Not on file  Social History Narrative   Not on file   Social Drivers of Health   Financial Resource Strain: Low Risk  (02/09/2023)   Overall Financial Resource Strain (CARDIA)    Difficulty of Paying Living Expenses: Not hard at all  Food Insecurity: No Food Insecurity (02/09/2023)   Hunger Vital Sign    Worried About Running Out of Food in the Last Year: Never true    Ran Out of Food in the Last Year: Never true  Transportation Needs: No Transportation Needs (02/09/2023)   PRAPARE - Administrator, Civil Service (Medical): No    Lack of Transportation (Non-Medical): No  Physical  Activity: Insufficiently Active (02/09/2023)   Exercise Vital Sign    Days of Exercise per Week: 3 days    Minutes of Exercise per Session: 20 min  Stress: Stress Concern Present (02/09/2023)   [employer] of Occupational Health - Occupational Stress Questionnaire    Feeling of Stress : Very much  Social Connections: Moderately Integrated (02/09/2023)   Social Connection and Isolation Panel    Frequency of Communication with Friends and Family: Once a week    Frequency of Social Gatherings with Friends and Family: Once a week    Attends Religious Services: 1 to 4 times per year    Active Member of [employer] or Organizations: Yes    Attends Engineer, structural: More than 4 times per year    Marital Status: Married  Catering manager Violence: Not on file    Family History:  Family History  Problem  Relation Age of Onset   Healthy Mother    Gout Maternal Grandmother    Gout Paternal Grandfather    Colon cancer Neg Hx    Colon polyps Neg Hx    Esophageal cancer Neg Hx    Rectal cancer Neg Hx    Stomach cancer Neg Hx     Medications:   Current Outpatient Medications on File Prior to Visit  Medication Sig Dispense Refill   acetaminophen  (TYLENOL ) 500 MG tablet Take 500 mg by mouth as needed for headache.     aspirin  EC 81 MG tablet Take 1 tablet (81 mg total) by mouth daily. Swallow whole.     atorvastatin  (LIPITOR) 10 MG tablet Take 1 tablet (10 mg total) by mouth daily. 90 tablet 3   carboxymethylcellulose (REFRESH PLUS) 0.5 % SOLN Place 2 drops into both eyes as needed (dry eyes after contact use).     colchicine  0.6 MG tablet Take 1.2mg  initially followed by 0.6mg  1 hour later.  Continue 0.6mg  daily until resolution (Patient taking differently: Take 0.6 mg by mouth daily as needed (Gout flare). Take 1.2mg  initially followed by 0.6mg  1 hour later.  Continue 0.6mg  daily until resolution) 30 tablet 2   indomethacin  (INDOCIN ) 50 MG capsule TAKE 1 CAPSULE BY MOUTH THREE TIMES  DAILY AS NEEDED FOR GOUT ATTACKS 30 capsule 0   losartan  (COZAAR ) 25 MG tablet Take 1 tablet (25 mg total) by mouth daily. 90 tablet 3   metFORMIN  (GLUCOPHAGE -XR) 500 MG 24 hr tablet Take 1 tablet (500 mg total) by mouth 2 (two) times daily with a meal. 180 tablet 1   topiramate  (TOPAMAX ) 50 MG tablet Take 1 tablet (50 mg total) by mouth at bedtime. 30 tablet 11   triamcinolone  cream (KENALOG ) 0.1 % Applied to rash on legs twice daily as needed. (Patient taking differently: Apply 1 Application topically daily as needed (Eczema). Applied to rash on legs twice daily as needed.) 80 g 0   No current facility-administered medications on file prior to visit.    Allergies:  No Known Allergies    OBJECTIVE:  Physical Exam  Vitals:   12/13/23 1426  BP: 133/80  Pulse: 77  Weight: 211 lb (95.7 kg)  Height: 5' 11 (1.803 m)    Body mass index is 29.43 kg/m. No results found.   General: well developed, well nourished, very pleasant middle-age Caucasian male, seated, in no evident distress Head: head normocephalic and atraumatic.   Neck: supple with no carotid or supraclavicular bruits Cardiovascular: regular rate and rhythm, no murmurs  Neurologic Exam Mental Status: Awake and fully alert.  Fluent speech and language.  Oriented to place and time. Recent and remote memory intact. Attention span, concentration and fund of knowledge appropriate. Mood and affect appropriate.  Cranial Nerves: Pupils equal, briskly reactive to light. Extraocular movements full without nystagmus. Visual fields full to confrontation. Hearing intact. Facial sensation intact. Face, tongue, palate moves normally and symmetrically.  Motor: Normal bulk and tone. Normal strength in all tested extremity muscles Sensory.:  Intact to all tested extremities Coordination: Rapid alternating movements normal in all extremities. Finger-to-nose and heel-to-shin performed accurately bilaterally. Gait and Station: Arises from  chair without difficulty. Stance is normal. Gait demonstrates normal stride length and balance without use of AD.  Reflexes: 1+ and symmetric. Toes downgoing.         ASSESSMENT: Joshua Morse is a 58 y.o. year old male with left thalamocapsular infarct in 06/2023 likely secondary to small vessel disease. Vascular  risk factors include HTN, HLD, DM, and THC use.      PLAN:  Ischemic stroke:  Residual deficit: right sided paresthesias (torso and RUE). Stable.   Continue aspirin  81mg  daily and atorvastatin  (Lipitor) for secondary stroke prevention managed/prescribed by PCP.   Discussed secondary stroke prevention measures and importance of close PCP follow up for aggressive stroke risk factor management including BP goal<130/90, HLD with LDL goal<70 and DM with A1c.<7 .  Stroke labs 06/2023: LDL 66, A1c 6.7 - f/u with PCP next month for repeat lab work I have gone over the pathophysiology of stroke, warning signs and symptoms, risk factors and their management in some detail with instructions to go to the closest emergency room for symptoms of concern.  chronic headaches: Suspect post stroke vascular vs tension type headache.  Gradually improving, now 1-2x per week, previously daily  Continue topamax  as needed (was rx'd 50mg  nightly  but only using as needed with benefit). Refill up to date.     No further recommendations from neurological standpoint and is closely being followed by PCP.  He can follow-up on an as-needed basis at this time.  As noted above, if he remains on topiramate  as needed, can schedule a f/u visit next spring if PCP unable to fill.       CC:  [facility] provider: Dr. Rosemarie PCP: Berneta Elsie Sayre, MD    I personally spent a total of 25 minutes in the care of the patient today including preparing to see the patient, performing a medically appropriate exam/evaluation, counseling and educating, and documenting clinical information in the EHR.   Harlene Bogaert,  AGNP-BC  [facility] [address] [place], [place] [address]  Phone [phone] Fax [phone] Note: This document was prepared with digital dictation and possible smart phrase technology. Any transcriptional errors that result from this process are unintentional.

## 2023-12-13 ENCOUNTER — Ambulatory Visit: Admitting: Adult Health

## 2023-12-13 ENCOUNTER — Encounter: Payer: Self-pay | Admitting: Adult Health

## 2023-12-13 VITALS — BP 133/80 | HR 77 | Ht 71.0 in | Wt 211.0 lb

## 2023-12-13 DIAGNOSIS — G441 Vascular headache, not elsewhere classified: Secondary | ICD-10-CM

## 2023-12-13 DIAGNOSIS — I6381 Other cerebral infarction due to occlusion or stenosis of small artery: Secondary | ICD-10-CM

## 2023-12-13 NOTE — Patient Instructions (Signed)
 Continue topamax  50mg  as needed for headache prevention  Continue aspirin  81 mg daily  and atorvastatin   for secondary stroke prevention  Continue to follow up with PCP regarding blood pressure, diabetes and cholesterol management  Maintain strict control of hypertension with blood pressure goal below 130/90, diabetes with hemoglobin A1c goal below 7.0 % and cholesterol with LDL cholesterol (bad cholesterol) goal below 70 mg/dL.   Signs of a Stroke? Follow the BEFAST method:  Balance Watch for a sudden loss of balance, trouble with coordination or vertigo Eyes Is there a sudden loss of vision in one or both eyes? Or double vision?  Face: Ask the person to smile. Does one side of the face droop or is it numb?  Arms: Ask the person to raise both arms. Does one arm drift downward? Is there weakness or numbness of a leg? Speech: Ask the person to repeat a simple phrase. Does the speech sound slurred/strange? Is the person confused ? Time: If you observe any of these signs, call 911.        Thank you for coming to see us  at [facility]. I hope we have been able to provide you high quality care today.  You may receive a patient satisfaction survey over the next few [place]. We would appreciate your feedback and comments so that we may continue to improve ourselves and the health of our patients.

## 2024-02-08 ENCOUNTER — Other Ambulatory Visit: Payer: Self-pay | Admitting: Family Medicine

## 2024-02-08 DIAGNOSIS — R7303 Prediabetes: Secondary | ICD-10-CM

## 2024-02-08 DIAGNOSIS — E7800 Pure hypercholesterolemia, unspecified: Secondary | ICD-10-CM

## 2024-02-21 ENCOUNTER — Ambulatory Visit: Admitting: Family Medicine

## 2024-02-21 ENCOUNTER — Ambulatory Visit: Payer: Self-pay | Admitting: Family Medicine

## 2024-02-21 ENCOUNTER — Encounter: Payer: Self-pay | Admitting: Family Medicine

## 2024-02-21 VITALS — BP 118/82 | HR 75 | Temp 98.4°F | Ht 71.0 in | Wt 207.0 lb

## 2024-02-21 DIAGNOSIS — Z23 Encounter for immunization: Secondary | ICD-10-CM

## 2024-02-21 DIAGNOSIS — R7303 Prediabetes: Secondary | ICD-10-CM

## 2024-02-21 DIAGNOSIS — Z8739 Personal history of other diseases of the musculoskeletal system and connective tissue: Secondary | ICD-10-CM | POA: Diagnosis not present

## 2024-02-21 DIAGNOSIS — J301 Allergic rhinitis due to pollen: Secondary | ICD-10-CM

## 2024-02-21 DIAGNOSIS — F341 Dysthymic disorder: Secondary | ICD-10-CM

## 2024-02-21 DIAGNOSIS — Z125 Encounter for screening for malignant neoplasm of prostate: Secondary | ICD-10-CM

## 2024-02-21 DIAGNOSIS — R0683 Snoring: Secondary | ICD-10-CM

## 2024-02-21 DIAGNOSIS — E7800 Pure hypercholesterolemia, unspecified: Secondary | ICD-10-CM | POA: Diagnosis not present

## 2024-02-21 LAB — URINALYSIS, ROUTINE W REFLEX MICROSCOPIC
Bilirubin Urine: NEGATIVE
Hgb urine dipstick: NEGATIVE
Ketones, ur: NEGATIVE
Leukocytes,Ua: NEGATIVE
Nitrite: NEGATIVE
RBC / HPF: NONE SEEN (ref 0–?)
Specific Gravity, Urine: <=1.005 — AB (ref 1.000–1.030)
Total Protein, Urine: NEGATIVE
Urine Glucose: NEGATIVE
Urobilinogen, UA: 0.2 (ref 0.0–1.0)
pH: 6 (ref 5.0–8.0)

## 2024-02-21 LAB — LDL CHOLESTEROL, DIRECT: Direct LDL: 69 mg/dL

## 2024-02-21 LAB — MICROALBUMIN / CREATININE URINE RATIO
Creatinine,U: 42.7 mg/dL
Microalb Creat Ratio: UNDETERMINED mg/g (ref 0.0–30.0)
Microalb, Ur: <0.7 mg/dL

## 2024-02-21 LAB — URIC ACID: Uric Acid, Serum: 6.2 mg/dL (ref 4.0–7.8)

## 2024-02-21 LAB — PSA: PSA: 1.11 ng/mL (ref 0.10–4.00)

## 2024-02-21 LAB — HEMOGLOBIN A1C: Hgb A1c MFr Bld: 6.3 % (ref 4.6–6.5)

## 2024-02-21 MED ORDER — MOMETASONE FUROATE 50 MCG/ACT NA SUSP: 2.0000 | Freq: Every day | NASAL | 12 refills | Status: AC

## 2024-02-21 MED ORDER — SERTRALINE HCL 25 MG PO TABS: 25.0000 mg | ORAL_TABLET | Freq: Every day | ORAL | 3 refills | Status: AC

## 2024-02-21 NOTE — Progress Notes (Signed)
 Established Patient Office Visit   Subjective:  Patient ID: Joshua Morse, male    DOB: 08-May-1965  Age: 58 y.o. MRN: [id number]  Chief Complaint  Patient presents with   Medical Management of Chronic Issues    Follow up. Pt is fasting.     HPI Encounter Diagnoses  Name Primary?   Pre-diabetes Yes   Elevated cholesterol    History of gout    Screening for prostate cancer    Immunization due    Dysthymia    Seasonal allergic rhinitis due to pollen    Snores    For follow-up of above.  Doing okay.  He is exercising regularly by walking for at least 30 minutes daily.  He does have regular dental and eye care.  Ongoing allergy symptoms that have been helped with Zyrtec.  Tells of morning nausea that is relieved after sneezing.  He does have ongoing postnasal drip even with Zyrtec therapy.  Ongoing stress at work.  There is sadness and stress.  He does snore.   Review of Systems  Constitutional: Negative.   HENT:  Positive for congestion.   Eyes:  Negative for blurred vision, discharge and redness.  Respiratory: Negative.    Cardiovascular: Negative.   Gastrointestinal:  Negative for abdominal pain, blood in stool, constipation and melena.  Genitourinary: Negative.   Musculoskeletal: Negative.  Negative for myalgias.  Skin:  Negative for rash.  Neurological:  Negative for tingling, loss of consciousness and weakness.  Endo/Heme/Allergies:  Negative for polydipsia.     Current Outpatient Medications:    acetaminophen  (TYLENOL ) 500 MG tablet, Take 500 mg by mouth as needed for headache., Disp: , Rfl:    atorvastatin  (LIPITOR) 10 MG tablet, TAKE 1 TABLET(10 MG) BY MOUTH DAILY, Disp: 30 tablet, Rfl: 0   carboxymethylcellulose (REFRESH PLUS) 0.5 % SOLN, Place 2 drops into both eyes as needed (dry eyes after contact use)., Disp: , Rfl:    colchicine  0.6 MG tablet, Take 1.2mg  initially followed by 0.6mg  1 hour later.  Continue 0.6mg  daily until resolution, Disp: 30 tablet, Rfl: 2    indomethacin  (INDOCIN ) 50 MG capsule, TAKE 1 CAPSULE BY MOUTH THREE TIMES DAILY AS NEEDED FOR GOUT ATTACKS, Disp: 30 capsule, Rfl: 0   losartan  (COZAAR ) 25 MG tablet, Take 1 tablet (25 mg total) by mouth daily., Disp: 90 tablet, Rfl: 3   metFORMIN  (GLUCOPHAGE -XR) 500 MG 24 hr tablet, TAKE 1 TABLET(500 MG) BY MOUTH TWICE DAILY WITH A MEAL, Disp: 60 tablet, Rfl: 0   mometasone (NASONEX) 50 MCG/ACT nasal spray, Place 2 sprays into the nose daily., Disp: 1 each, Rfl: 12   sertraline (ZOLOFT) 25 MG tablet, Take 1 tablet (25 mg total) by mouth daily., Disp: 30 tablet, Rfl: 3   triamcinolone  cream (KENALOG ) 0.1 %, Applied to rash on legs twice daily as needed., Disp: 80 g, Rfl: 0   aspirin  EC 81 MG tablet, Take 1 tablet (81 mg total) by mouth daily. Swallow whole., Disp: , Rfl:    Objective:     BP 118/82 (BP Location: Right Arm, Patient Position: Sitting, Cuff Size: Normal)   Pulse 75   Temp 98.4 F (36.9 C) (Temporal)   Ht 5' 11 (1.803 m)   Wt 207 lb (93.9 kg)   SpO2 98%   BMI 28.87 kg/m  BP Readings from Last 3 Encounters:  02/21/24 118/82  12/13/23 133/80  08/07/23 122/78   Wt Readings from Last 3 Encounters:  02/21/24 207 lb (93.9 kg)  12/13/23  211 lb (95.7 kg)  08/07/23 213 lb 9.6 oz (96.9 kg)      Physical Exam Constitutional:      General: He is not in acute distress.    Appearance: Normal appearance. He is not ill-appearing, toxic-appearing or diaphoretic.  HENT:     Head: Normocephalic and atraumatic.     Right Ear: Tympanic membrane, ear canal and external ear normal.     Left Ear: Tympanic membrane, ear canal and external ear normal.     Mouth/Throat:     Mouth: Mucous membranes are moist.     Pharynx: Oropharynx is clear. No oropharyngeal exudate or posterior oropharyngeal erythema.  Eyes:     General: No scleral icterus.       Right eye: No discharge.        Left eye: No discharge.     Extraocular Movements: Extraocular movements intact.      Conjunctiva/sclera: Conjunctivae normal.     Pupils: Pupils are equal, round, and reactive to light.  Cardiovascular:     Rate and Rhythm: Normal rate and regular rhythm.  Pulmonary:     Effort: Pulmonary effort is normal. No respiratory distress.     Breath sounds: Normal breath sounds. No wheezing or rales.  Abdominal:     General: Bowel sounds are normal.     Tenderness: There is no abdominal tenderness. There is no guarding or rebound.     Hernia: No hernia is present.  Musculoskeletal:     Cervical back: No rigidity or tenderness.  Lymphadenopathy:     Cervical: No cervical adenopathy.  Skin:    General: Skin is warm and dry.  Neurological:     Mental Status: He is alert and oriented to person, place, and time.  Psychiatric:        Mood and Affect: Mood normal.        Behavior: Behavior normal.      No results found for any visits on 02/21/24.    The ASCVD Risk score (Arnett DK, et al., 2019) failed to calculate for the following reasons:   Risk score cannot be calculated because patient has a medical history suggesting prior/existing ASCVD    Assessment & Plan:   Pre-diabetes -     Hemoglobin A1c -     Microalbumin / creatinine urine ratio -     Urinalysis, Routine w reflex microscopic  Elevated cholesterol -     LDL cholesterol, direct  History of gout -     Uric acid  Screening for prostate cancer -     PSA  Immunization due -     Tdap vaccine greater than or equal to 7yo IM -     Pneumococcal conjugate vaccine 20-valent  Dysthymia -     Sertraline HCl; Take 1 tablet (25 mg total) by mouth daily.  Dispense: 30 tablet; Refill: 3  Seasonal allergic rhinitis due to pollen -     Mometasone Furoate; Place 2 sprays into the nose daily.  Dispense: 1 each; Refill: 12  Snores -     Pulmonary Visit    Return in about 8 [place] (around 04/17/2024).  Will start low-dose sertraline for dysthymia with anxiety.  Follow-up in 8 [place].  I have added Nasonex for  seasonal allergies.  Apnea evaluation.  Elsie Sim Lent, MD

## 2024-03-09 ENCOUNTER — Other Ambulatory Visit: Payer: Self-pay | Admitting: Family Medicine

## 2024-03-09 DIAGNOSIS — R7303 Prediabetes: Secondary | ICD-10-CM

## 2024-03-09 DIAGNOSIS — E7800 Pure hypercholesterolemia, unspecified: Secondary | ICD-10-CM

## 2024-03-12 ENCOUNTER — Telehealth: Payer: Self-pay

## 2024-03-12 DIAGNOSIS — E7800 Pure hypercholesterolemia, unspecified: Secondary | ICD-10-CM

## 2024-03-12 DIAGNOSIS — R7303 Prediabetes: Secondary | ICD-10-CM

## 2024-03-12 MED ORDER — METFORMIN HCL ER 500 MG PO TB24: 500.0000 mg | ORAL_TABLET | Freq: Two times a day (BID) | ORAL | 1 refills | Status: AC

## 2024-03-12 MED ORDER — ATORVASTATIN CALCIUM 10 MG PO TABS: 10.0000 mg | ORAL_TABLET | Freq: Every day | ORAL | 1 refills | Status: AC

## 2024-03-12 NOTE — Telephone Encounter (Signed)
 Refill request for Metformin  and Atorvastatin . Dm/cma

## 2024-06-10 ENCOUNTER — Ambulatory Visit
